# Patient Record
Sex: Female | Born: 1995 | Race: White | Hispanic: No | Smoking: Former smoker
Health system: Southern US, Community
[De-identification: ages and names within clinical notes are randomized; demographics above are authoritative.]

## PROBLEM LIST (undated history)

## (undated) VITALS — BP 120/71 | HR 114 | Temp 98.4°F | Resp 18 | Ht 62.21 in | Wt 179.7 lb

## (undated) VITALS — BP 116/78 | HR 111 | Temp 98.2°F | Resp 18 | Ht 62.01 in | Wt 184.3 lb

## (undated) DIAGNOSIS — E079 Disorder of thyroid, unspecified: Secondary | ICD-10-CM

## (undated) DIAGNOSIS — E669 Obesity, unspecified: Secondary | ICD-10-CM

## (undated) DIAGNOSIS — F32A Depression, unspecified: Secondary | ICD-10-CM

## (undated) DIAGNOSIS — N201 Calculus of ureter: Secondary | ICD-10-CM

## (undated) DIAGNOSIS — F329 Major depressive disorder, single episode, unspecified: Secondary | ICD-10-CM

## (undated) DIAGNOSIS — F419 Anxiety disorder, unspecified: Secondary | ICD-10-CM

## (undated) HISTORY — PX: NO PAST SURGERIES: SHX2092

---
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
## 2020-10-14 ENCOUNTER — Other Ambulatory Visit: Payer: Self-pay

## 2020-10-14 ENCOUNTER — Emergency Department (HOSPITAL_COMMUNITY)
Admission: EM | Admit: 2020-10-14 | Discharge: 2020-10-15 | Disposition: A | Discharge status: Home / Self Care | Payer: Self-pay | Attending: Emergency Medicine | Admitting: Emergency Medicine

## 2020-10-14 ENCOUNTER — Encounter (HOSPITAL_COMMUNITY): Payer: Self-pay | Admitting: Emergency Medicine

## 2020-10-14 DIAGNOSIS — Z87891 Personal history of nicotine dependence: Secondary | ICD-10-CM | POA: Insufficient documentation

## 2020-10-14 DIAGNOSIS — N200 Calculus of kidney: Secondary | ICD-10-CM | POA: Insufficient documentation

## 2020-10-14 LAB — COMPREHENSIVE METABOLIC PANEL
ALT: 30 U/L (ref 0–44)
AST: 26 U/L (ref 15–41)
Albumin: 4.1 g/dL (ref 3.5–5.0)
Alkaline Phosphatase: 71 U/L (ref 38–126)
Anion gap: 12 (ref 5–15)
BUN: 7 mg/dL (ref 6–20)
CO2: 22 mmol/L (ref 22–32)
Calcium: 9.7 mg/dL (ref 8.9–10.3)
Chloride: 106 mmol/L (ref 98–111)
Creatinine, Ser: 0.98 mg/dL (ref 0.44–1.00)
GFR, Estimated: >60 mL/min (ref >60)
Glucose, Bld: 97 mg/dL (ref 70–99)
Potassium: 3.8 mmol/L (ref 3.5–5.1)
Sodium: 140 mmol/L (ref 135–145)
Total Bilirubin: 0.8 mg/dL (ref 0.3–1.2)
Total Protein: 7.1 g/dL (ref 6.5–8.1)

## 2020-10-14 LAB — URINALYSIS, ROUTINE W REFLEX MICROSCOPIC
Bilirubin Urine: NEGATIVE
Glucose, UA: NEGATIVE mg/dL
Ketones, ur: NEGATIVE mg/dL
Nitrite: NEGATIVE
Protein, ur: NEGATIVE mg/dL
Specific Gravity, Urine: 1.002 — ABNORMAL LOW (ref 1.005–1.030)
pH: 6 (ref 5.0–8.0)

## 2020-10-14 LAB — CBC
HCT: 44.8 % (ref 36.0–46.0)
Hemoglobin: 15.2 g/dL — ABNORMAL HIGH (ref 12.0–15.0)
MCH: 31.3 pg (ref 26.0–34.0)
MCHC: 33.9 g/dL (ref 30.0–36.0)
MCV: 92.4 fL (ref 80.0–100.0)
Platelets: 319 10*3/uL (ref 150–400)
RBC: 4.85 MIL/uL (ref 3.87–5.11)
RDW: 11.8 % (ref 11.5–15.5)
WBC: 11.7 10*3/uL — ABNORMAL HIGH (ref 4.0–10.5)
nRBC: 0 % (ref 0.0–0.2)

## 2020-10-14 LAB — I-STAT BETA HCG BLOOD, ED (MC, WL, AP ONLY): I-stat hCG, quantitative: <5 m[IU]/mL (ref <5)

## 2020-10-14 LAB — LIPASE, BLOOD: Lipase: 45 U/L (ref 11–51)

## 2020-10-14 NOTE — ED Triage Notes (Signed)
Patient reports RLQ pain for several months  with emesis and diarrhea , denies fever or chills .

## 2020-10-15 ENCOUNTER — Emergency Department (HOSPITAL_COMMUNITY): Payer: Self-pay

## 2020-10-15 ENCOUNTER — Encounter: Payer: Self-pay | Admitting: *Deleted

## 2020-10-15 MED ORDER — IOHEXOL 300 MG/ML  SOLN
100.0000 mL | Freq: Once | INTRAMUSCULAR | Status: AC | PRN
  Administered 2020-10-15: 100 mL via INTRAVENOUS

## 2020-10-15 MED ORDER — KETOROLAC TROMETHAMINE 30 MG/ML IJ SOLN
30.0000 mg | Freq: Once | INTRAMUSCULAR | Status: AC
  Administered 2020-10-15: 30 mg via INTRAVENOUS
  Filled 2020-10-15: qty 1

## 2020-10-15 MED ORDER — ACETAMINOPHEN 500 MG PO TABS
1000.0000 mg | ORAL_TABLET | Freq: Once | ORAL | Status: AC
  Administered 2020-10-15: 1000 mg via ORAL
  Filled 2020-10-15: qty 2

## 2020-10-15 MED ORDER — HYDROCODONE-ACETAMINOPHEN 5-325 MG PO TABS
1.0000 | ORAL_TABLET | Freq: Four times a day (QID) | ORAL | 0 refills | Status: DC | PRN
Start: 2020-10-15 — End: 2022-05-14

## 2020-10-15 NOTE — Discharge Instructions (Addendum)
Please follow-up with the urologist if you do not pass the stone in the next 2 to 3 days.  If your symptoms change or worsen return to the emergency department.  Take pain medication as prescribed.   You will need to strain your urine.

## 2020-10-15 NOTE — Progress Notes (Signed)
RNCM contacted to assist with follow-up appointment.  Pt has [employer] with an assigned PCP.  RNCM explained to pt that she must contact DSS to have PCP changed in order to be seen by another MD.  Pt verbalizes understanding.  Nicholas Trompeter J. Lucretia Roers, RN, BSN, [place] [phone]

## 2020-10-15 NOTE — ED Provider Notes (Signed)
[facility] EMERGENCY DEPARTMENT Provider Note   CSN: [id number] Arrival date & time: 10/14/20  2152     History Chief Complaint  Patient presents with  . Abdominal Pain    Patty Wilcox is a 24 y.o. female.  Patient presents to the emergency department with a chief complaint of right lower abdominal pain.  She states pain worsened today, but she has experienced it intermittently for months.  She denies fever, chills. She reports associated vomiting and diarrhea. She states that she does feel anxious, and "not right."  She reports having these types of symptoms for over a year, but the right lower abdominal pain has worsened today.  She states that she has been constipated.  She denies any dysuria.  Denies new or unusual vaginal discharge or bleeding.  The history is provided by the patient. No language interpreter was used.       Past Medical History:  Diagnosis Date  . Anxiety   . Anxiety   . Depression   . Obesity     Patient Active Problem List   Diagnosis Date Noted  . Tobacco abuse 11/13/2015  . Borderline personality disorder (HCC) 04/23/2012  . 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 Generalized anxiety disorder 04/19/2012  . Oppositional defiant disorder 03/25/2012    Past Surgical History:  Procedure Laterality Date  . NO PAST SURGERIES       OB History   No obstetric history on file.     Family History  Problem Relation Age of Onset  . Mental illness Mother   . Drug abuse Mother   . Depression Father   . Hypertension Father     Social History   Tobacco Use  . Smoking status: Former Smoker    Packs/day: 1.00    Years: 4.00    Pack years: 4.00    Types: Cigarettes  . Smokeless tobacco: Never Used  Substance Use Topics  . Alcohol use: No  . Drug use: No    Types: Marijuana    Comment: Twice per year    Home Medications Prior to Admission medications   Medication Sig Start Date End Date Taking?  Authorizing Provider  albuterol (PROVENTIL HFA;VENTOLIN HFA) 108 (90 BASE) MCG/ACT inhaler Inhale 2 puffs into the lungs every 4 (four) hours as needed for wheezing or shortness of breath (cough, shortness of breath or wheezing.). 11/13/15   Dorna Leitz, PA-C  chlorpheniramine-HYDROcodone (TUSSIONEX PENNKINETIC ER) 10-8 MG/5ML SUER Take 5 mLs by mouth every 12 (twelve) hours as needed for cough. 11/13/15   Dorna Leitz, PA-C    Allergies    Other  Review of Systems   Review of Systems  All other systems reviewed and are negative.   Physical Exam Updated Vital Signs BP (!) 141/102   Pulse 99   Temp 98.6 F (37 C) (Oral)   Resp 17   Ht 5\' 3"  (1.6 m)   Wt 101 kg   LMP 10/04/2020   SpO2 100%   BMI 39.44 kg/m   Physical Exam Vitals and nursing note reviewed.  Constitutional:      General: She is not in acute distress.    Appearance: She is well-developed.  HENT:     Head: Normocephalic and atraumatic.  Eyes:     Conjunctiva/sclera: Conjunctivae normal.  Cardiovascular:     Rate and Rhythm: Normal rate and regular rhythm.     Heart sounds: No murmur heard.   Pulmonary:  Effort: Pulmonary effort is normal. No respiratory distress.     Breath sounds: Normal breath sounds.  Abdominal:     Palpations: Abdomen is soft.     Tenderness: There is abdominal tenderness.  Musculoskeletal:        General: Normal range of motion.     Cervical back: Neck supple.  Skin:    General: Skin is warm and dry.  Neurological:     Mental Status: She is alert and oriented to person, place, and time.  Psychiatric:        Mood and Affect: Mood normal.        Behavior: Behavior normal.     ED Results / Procedures / Treatments   Labs (all labs ordered are listed, but only abnormal results are displayed) Labs Reviewed  CBC - Abnormal; Notable for the following components:      Result Value   WBC 11.7 (*)    Hemoglobin 15.2 (*)    All other components within normal limits    URINALYSIS, ROUTINE W REFLEX MICROSCOPIC - Abnormal; Notable for the following components:   Color, Urine STRAW (*)    APPearance HAZY (*)    Specific Gravity, Urine 1.002 (*)    Hgb urine dipstick MODERATE (*)    Leukocytes,Ua SMALL (*)    Bacteria, UA RARE (*)    All other components within normal limits  LIPASE, BLOOD  COMPREHENSIVE METABOLIC PANEL  I-STAT BETA HCG BLOOD, ED ([facility], [facility], [facility] ONLY)    EKG None  Radiology CT ABDOMEN PELVIS W CONTRAST  Result Date: 10/15/2020 CLINICAL DATA:  Right lower quadrant abdominal pain with vomiting and diarrhea EXAM: CT ABDOMEN AND PELVIS WITH CONTRAST TECHNIQUE: Multidetector CT imaging of the abdomen and pelvis was performed using the standard protocol following bolus administration of intravenous contrast. CONTRAST:  OMNIPAQUE IOHEXOL 300 MG/ML  SOLN COMPARISON:  09/11/2009 FINDINGS: LOWER CHEST: Right basilar atelectasis. HEPATOBILIARY: Normal hepatic contours. No intra- or extrahepatic biliary dilatation. The gallbladder is normal. PANCREAS: Normal pancreas. No ductal dilatation or peripancreatic fluid collection. SPLEEN: Normal. ADRENALS/URINARY TRACT: The adrenal glands are normal. 4 mm left mid ureteral calculus with mild hydroureter. Right kidney and ureter are normal. The urinary bladder is normal for degree of distention STOMACH/BOWEL: There is no hiatal hernia. Normal duodenal course and caliber. No small bowel dilatation or inflammation. No focal colonic abnormality. Normal appendix. VASCULAR/LYMPHATIC: Normal course and caliber of the major abdominal vessels. No abdominal or pelvic lymphadenopathy. REPRODUCTIVE: Normal uterus. No adnexal mass. MUSCULOSKELETAL. No bony spinal canal stenosis or focal osseous abnormality. OTHER: None. IMPRESSION: 1. 4 mm left mid ureteral calculus with mild hydroureter. 2. Normal appendix. Electronically Signed   By: Deatra Robinson M.D.   On: 10/15/2020 03:35    Procedures Procedures (including critical  care time)  Medications Ordered in ED Medications  iohexol (OMNIPAQUE) 300 MG/ML solution 100 mL (100 mLs Intravenous Contrast Given 10/15/20 0321)    ED Course  I have reviewed the triage vital signs and the nursing notes.  Pertinent labs & imaging results that were available during my care of the patient were reviewed by me and considered in my medical decision making (see chart for details).    MDM Rules/Calculators/A&P                          Patient presents to the emergency department with abdominal pain.  She also complains of anxiety.  She states she has been having  intermittent abdominal pain for several months.  She has had some nausea, vomiting, diarrhea.  She does have some mild to moderate right lower quadrant abdominal tenderness, I have lower suspicion for appendicitis, but given her ongoing symptoms, feel that it may be reasonable to get a CT scan.  CT shows 4 mm ureterolithiasis.  No evidence of infection on UA.  Pain is well controlled.  We will plan for discharge with outpatient urology follow-up.  I have placed consult for social work for PCP needs.  Return precautions discussed. Final Clinical Impression(s) / ED Diagnoses Final diagnoses:  Kidney stone    Rx / DC Orders ED Discharge Orders    None       Roxy Horseman, PA-C 10/15/20 0354    Mesner, Barbara Cower, MD 10/15/20 [phone]

## 2020-10-15 NOTE — ED Notes (Signed)
Pt outside.  

## 2021-02-17 ENCOUNTER — Emergency Department (HOSPITAL_COMMUNITY): Payer: Self-pay

## 2021-02-17 ENCOUNTER — Emergency Department (HOSPITAL_COMMUNITY)
Admission: EM | Admit: 2021-02-17 | Discharge: 2021-02-17 | Disposition: A | Discharge status: Home / Self Care | Payer: Self-pay | Attending: Emergency Medicine | Admitting: Emergency Medicine

## 2021-02-17 ENCOUNTER — Encounter (HOSPITAL_COMMUNITY): Payer: Self-pay

## 2021-02-17 ENCOUNTER — Other Ambulatory Visit: Payer: Self-pay

## 2021-02-17 DIAGNOSIS — Z7982 Long term (current) use of aspirin: Secondary | ICD-10-CM | POA: Insufficient documentation

## 2021-02-17 DIAGNOSIS — R197 Diarrhea, unspecified: Secondary | ICD-10-CM | POA: Insufficient documentation

## 2021-02-17 DIAGNOSIS — R1031 Right lower quadrant pain: Secondary | ICD-10-CM

## 2021-02-17 DIAGNOSIS — N390 Urinary tract infection, site not specified: Secondary | ICD-10-CM | POA: Insufficient documentation

## 2021-02-17 DIAGNOSIS — F17210 Nicotine dependence, cigarettes, uncomplicated: Secondary | ICD-10-CM | POA: Insufficient documentation

## 2021-02-17 HISTORY — DX: Disorder of thyroid, unspecified: E07.9

## 2021-02-17 LAB — URINALYSIS, ROUTINE W REFLEX MICROSCOPIC
Bilirubin Urine: NEGATIVE
Glucose, UA: NEGATIVE mg/dL
Ketones, ur: NEGATIVE mg/dL
Nitrite: NEGATIVE
Protein, ur: NEGATIVE mg/dL
Specific Gravity, Urine: 1.019 (ref 1.005–1.030)
pH: 6 (ref 5.0–8.0)

## 2021-02-17 LAB — COMPREHENSIVE METABOLIC PANEL
ALT: 30 U/L (ref 0–44)
AST: 39 U/L (ref 15–41)
Albumin: 4.4 g/dL (ref 3.5–5.0)
Alkaline Phosphatase: 81 U/L (ref 38–126)
Anion gap: 14 (ref 5–15)
BUN: 12 mg/dL (ref 6–20)
CO2: 21 mmol/L — ABNORMAL LOW (ref 22–32)
Calcium: 9.9 mg/dL (ref 8.9–10.3)
Chloride: 105 mmol/L (ref 98–111)
Creatinine, Ser: 1.04 mg/dL — ABNORMAL HIGH (ref 0.44–1.00)
GFR, Estimated: >60 mL/min (ref >60)
Glucose, Bld: 98 mg/dL (ref 70–99)
Potassium: 3.4 mmol/L — ABNORMAL LOW (ref 3.5–5.1)
Sodium: 140 mmol/L (ref 135–145)
Total Bilirubin: 0.8 mg/dL (ref 0.3–1.2)
Total Protein: 7.8 g/dL (ref 6.5–8.1)

## 2021-02-17 LAB — CBC
HCT: 43.6 % (ref 36.0–46.0)
Hemoglobin: 15.2 g/dL — ABNORMAL HIGH (ref 12.0–15.0)
MCH: 32.5 pg (ref 26.0–34.0)
MCHC: 34.9 g/dL (ref 30.0–36.0)
MCV: 93.2 fL (ref 80.0–100.0)
Platelets: 321 10*3/uL (ref 150–400)
RBC: 4.68 MIL/uL (ref 3.87–5.11)
RDW: 12.5 % (ref 11.5–15.5)
WBC: 13.8 10*3/uL — ABNORMAL HIGH (ref 4.0–10.5)
nRBC: 0 % (ref 0.0–0.2)

## 2021-02-17 LAB — I-STAT BETA HCG BLOOD, ED (MC, WL, AP ONLY): I-stat hCG, quantitative: <5 m[IU]/mL (ref <5)

## 2021-02-17 LAB — LIPASE, BLOOD: Lipase: 30 U/L (ref 11–51)

## 2021-02-17 MED ORDER — KETOROLAC TROMETHAMINE 30 MG/ML IJ SOLN
30.0000 mg | Freq: Once | INTRAMUSCULAR | Status: AC
  Administered 2021-02-17: 30 mg via INTRAVENOUS
  Filled 2021-02-17: qty 1

## 2021-02-17 MED ORDER — IOHEXOL 300 MG/ML  SOLN
100.0000 mL | Freq: Once | INTRAMUSCULAR | Status: AC | PRN
  Administered 2021-02-17: 100 mL via INTRAVENOUS

## 2021-02-17 MED ORDER — ONDANSETRON HCL 4 MG/2ML IJ SOLN
4.0000 mg | Freq: Once | INTRAMUSCULAR | Status: AC
  Administered 2021-02-17: 4 mg via INTRAVENOUS
  Filled 2021-02-17: qty 2

## 2021-02-17 MED ORDER — MORPHINE SULFATE (PF) 4 MG/ML IV SOLN
4.0000 mg | Freq: Once | INTRAVENOUS | Status: AC
  Administered 2021-02-17: 4 mg via INTRAVENOUS
  Filled 2021-02-17: qty 1

## 2021-02-17 MED ORDER — NITROFURANTOIN MONOHYD MACRO 100 MG PO CAPS
100.0000 mg | ORAL_CAPSULE | Freq: Once | ORAL | Status: AC
  Administered 2021-02-17: 100 mg via ORAL
  Filled 2021-02-17: qty 1

## 2021-02-17 MED ORDER — NITROFURANTOIN MONOHYD MACRO 100 MG PO CAPS: 100.0000 mg | ORAL_CAPSULE | Freq: Two times a day (BID) | ORAL | 0 refills | Status: AC

## 2021-02-17 MED ORDER — SODIUM CHLORIDE 0.9 % IV BOLUS
1000.0000 mL | Freq: Once | INTRAVENOUS | Status: AC
  Administered 2021-02-17: 1000 mL via INTRAVENOUS

## 2021-02-17 NOTE — ED Provider Notes (Signed)
[facility]-EMERGENCY DEPT Provider Note   CSN: [id number] Arrival date & time: 02/17/21  1755     History Chief Complaint  Patient presents with  . Abdominal Pain    Patty Wilcox is a 25 y.o. female.  The history is provided by the patient.  Abdominal Pain Pain location:  RLQ Pain quality: stabbing   Pain radiates to:  Does not radiate Pain severity:  Severe Onset quality:  Sudden Duration:  1 day Timing:  Constant Chronicity:  Recurrent Context comment:  Diagnosed with a 4 mm kidney stone many months ago.  Symptoms resolved spontaneously and returned today. Relieved by:  Nothing Exacerbated by: Sitting up. Ineffective treatments:  None tried Associated symptoms: diarrhea and nausea   Associated symptoms: no anorexia, no chest pain, no chills, no constipation, no cough, no dysuria, no fever, no hematuria, no shortness of breath, no sore throat, no vaginal bleeding, no vaginal discharge and no vomiting   Associated symptoms comment:  Menstrual period 2 weeks ago.  States her urine has been somewhat cloudy recently. Risk factors comment:  [employer] and has received care mainly through urgent cares and emergency departments.  States that she has a variety of chronic and unaddressed health concerns.      Past Medical History:  Diagnosis Date  . Anxiety   . Anxiety   . Depression   . Obesity   . Thyroid disease     Patient Active Problem List   Diagnosis Date Noted  . Tobacco abuse 11/13/2015  . Borderline personality disorder (HCC) 04/23/2012  . 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 Generalized anxiety disorder 04/19/2012  . Oppositional defiant disorder 03/25/2012    Past Surgical History:  Procedure Laterality Date  . NO PAST SURGERIES       OB History   No obstetric history on file.     Family History  Problem Relation Age of Onset  . Mental illness Mother   . Drug abuse Mother   . Depression  Father   . Hypertension Father     Social History   Tobacco Use  . Smoking status: Current Every Day Smoker    Packs/day: 0.15    Years: 4.00    Pack years: 0.60    Types: Cigarettes  . Smokeless tobacco: Never Used  Vaping Use  . Vaping Use: Every day  . Substances: Nicotine, Flavoring  Substance Use Topics  . Alcohol use: No  . Drug use: Yes    Types: Marijuana    Comment: Twice per year    Home Medications Prior to Admission medications   Medication Sig Start Date End Date Taking? Authorizing Provider  aspirin-acetaminophen-caffeine (EXCEDRIN MIGRAINE) [phone] MG tablet Take 2 tablets by mouth every 6 (six) hours as needed for headache.    [provider]  HYDROcodone-acetaminophen (NORCO/VICODIN) 5-325 MG tablet Take 1-2 tablets by mouth every 6 (six) hours as needed. 10/15/20   Roxy Horseman, PA-C    Allergies    Other  Review of Systems   Review of Systems  Constitutional: Negative for chills and fever.  HENT: Negative for ear pain and sore throat.   Eyes: Negative for pain and visual disturbance.  Respiratory: Negative for cough and shortness of breath.   Cardiovascular: Negative for chest pain and palpitations.  Gastrointestinal: Positive for abdominal pain, diarrhea and nausea. Negative for anorexia, constipation and vomiting.       At least 1 year of chronic and intermittent changes in  bowel movements  Endocrine:       Was told she had hypothyroidism but is untreated.  Genitourinary: Negative for dysuria, hematuria, vaginal bleeding and vaginal discharge.  Musculoskeletal: Negative for arthralgias and back pain.  Skin: Negative for color change and rash.  Neurological: Negative for seizures and syncope.  All other systems reviewed and are negative.   Physical Exam Updated Vital Signs BP (!) 159/115 (BP Location: Left Arm)   Pulse (!) 140   Temp 98.2 F (36.8 C) (Oral)   Resp 16   Ht 5\' 3"  (1.6 m)   Wt 93 kg   LMP 01/29/2021  (Approximate)   SpO2 100%   BMI 36.31 kg/m   Physical Exam Vitals and nursing note reviewed.  Constitutional:      Comments: Standing up in the room, pacing, holding her right lower quadrant  HENT:     Head: Normocephalic and atraumatic.  Eyes:     General: No scleral icterus. Pulmonary:     Effort: Pulmonary effort is normal. No respiratory distress.  Abdominal:     Tenderness: There is abdominal tenderness in the right lower quadrant. There is guarding. There is no right CVA tenderness or left CVA tenderness.  Musculoskeletal:     Cervical back: Normal range of motion.  Skin:    General: Skin is warm and dry.  Neurological:     General: No focal deficit present.     Mental Status: She is alert.  Psychiatric:        Mood and Affect: Mood normal.     ED Results / Procedures / Treatments   Labs (all labs ordered are listed, but only abnormal results are displayed) Labs Reviewed  COMPREHENSIVE METABOLIC PANEL - Abnormal; Notable for the following components:      Result Value   Potassium 3.4 (*)    CO2 21 (*)    Creatinine, Ser 1.04 (*)    All other components within normal limits  CBC - Abnormal; Notable for the following components:   WBC 13.8 (*)    Hemoglobin 15.2 (*)    All other components within normal limits  URINALYSIS, ROUTINE W REFLEX MICROSCOPIC - Abnormal; Notable for the following components:   APPearance TURBID (*)    Hgb urine dipstick MODERATE (*)    Leukocytes,Ua LARGE (*)    Bacteria, UA RARE (*)    All other components within normal limits  LIPASE, BLOOD  I-STAT BETA HCG BLOOD, ED ([facility], [facility], [facility] ONLY)    EKG None  Radiology CT Abdomen Pelvis W Contrast  Result Date: 02/17/2021 CLINICAL DATA:  Acute right lower quadrant abdominal pain. EXAM: CT ABDOMEN AND PELVIS WITH CONTRAST TECHNIQUE: Multidetector CT imaging of the abdomen and pelvis was performed using the standard protocol following bolus administration of intravenous contrast. CONTRAST:   02/19/2021 OMNIPAQUE IOHEXOL 300 MG/ML  SOLN COMPARISON:  October 15, 2020. FINDINGS: Lower chest: No acute abnormality. Hepatobiliary: No focal liver abnormality is seen. No gallstones, gallbladder wall thickening, or biliary dilatation. Pancreas: Unremarkable. No pancreatic ductal dilatation or surrounding inflammatory changes. Spleen: Normal in size without focal abnormality. Adrenals/Urinary Tract: Adrenal glands are unremarkable. Nonobstructive left renal calculus is noted. No hydronephrosis or renal obstruction is noted. Bladder is unremarkable. Stomach/Bowel: Stomach is within normal limits. Appendix appears normal. No evidence of bowel wall thickening, distention, or inflammatory changes. Vascular/Lymphatic: No significant vascular findings are present. No enlarged abdominal or pelvic lymph nodes. Reproductive: Uterus and bilateral adnexa are unremarkable. Other: No abdominal wall hernia or  abnormality. No abdominopelvic ascites. Musculoskeletal: No acute or significant osseous findings. IMPRESSION: 1. Nonobstructive left renal calculus. No hydronephrosis or renal obstruction is noted. 2. No other abnormality seen in the abdomen or pelvis. Electronically Signed   By: Lupita Raider M.D.   On: 02/17/2021 21:11    Procedures Procedures   Medications Ordered in ED Medications  sodium chloride 0.9 % bolus 1,000 mL (0 mLs Intravenous Stopped 02/17/21 1923)  ketorolac (TORADOL) 30 MG/ML injection 30 mg (30 mg Intravenous Given 02/17/21 1834)  ondansetron (ZOFRAN) injection 4 mg (4 mg Intravenous Given 02/17/21 1835)  morphine 4 MG/ML injection 4 mg (4 mg Intravenous Given 02/17/21 1941)  ondansetron (ZOFRAN) injection 4 mg (4 mg Intravenous Given 02/17/21 1941)  iohexol (OMNIPAQUE) 300 MG/ML solution 100 mL (100 mLs Intravenous Contrast Given 02/17/21 2048)    ED Course  I have reviewed the triage vital signs and the nursing notes.  Pertinent labs & imaging results that were available during my care of  the patient were reviewed by me and considered in my medical decision making (see chart for details).  The patient presents with severe RLQ pain. She was evaluated for appendicitis, renal colic, biliary pathology. ED evaluation was relatively normal with the exception of a probable UTI for which she will be treated. Careful return precautions were discussed. MDM Rules/Calculators/A&P     Final Clinical Impression(s) / ED Diagnoses Final diagnoses:  Right lower quadrant abdominal pain  Lower urinary tract infectious disease    Rx / DC Orders ED Discharge Orders         Ordered    nitrofurantoin, macrocrystal-monohydrate, (MACROBID) 100 MG capsule  2 times daily        02/17/21 2149           Koleen Distance, MD 02/17/21 2152

## 2021-02-17 NOTE — ED Triage Notes (Signed)
Patient c/o RLQ pain x 1 week. patient also c/o nausea and diarrhea x 2. patient denies any urinary issues.

## 2021-12-23 ENCOUNTER — Emergency Department (HOSPITAL_COMMUNITY)
Admission: EM | Admit: 2021-12-23 | Discharge: 2021-12-23 | Disposition: A | Discharge status: Home / Self Care | Payer: Self-pay | Attending: Emergency Medicine | Admitting: Emergency Medicine

## 2021-12-23 ENCOUNTER — Emergency Department (HOSPITAL_COMMUNITY): Payer: Self-pay

## 2021-12-23 ENCOUNTER — Other Ambulatory Visit: Payer: Self-pay

## 2021-12-23 ENCOUNTER — Encounter (HOSPITAL_COMMUNITY): Payer: Self-pay | Admitting: Radiology

## 2021-12-23 DIAGNOSIS — N9489 Other specified conditions associated with female genital organs and menstrual cycle: Secondary | ICD-10-CM | POA: Insufficient documentation

## 2021-12-23 DIAGNOSIS — N200 Calculus of kidney: Secondary | ICD-10-CM | POA: Insufficient documentation

## 2021-12-23 LAB — CBC
HCT: 41.4 % (ref 36.0–46.0)
Hemoglobin: 14.3 g/dL (ref 12.0–15.0)
MCH: 31.3 pg (ref 26.0–34.0)
MCHC: 34.5 g/dL (ref 30.0–36.0)
MCV: 90.6 fL (ref 80.0–100.0)
Platelets: 313 10*3/uL (ref 150–400)
RBC: 4.57 MIL/uL (ref 3.87–5.11)
RDW: 12.2 % (ref 11.5–15.5)
WBC: 10.8 10*3/uL — ABNORMAL HIGH (ref 4.0–10.5)
nRBC: 0 % (ref 0.0–0.2)

## 2021-12-23 LAB — URINALYSIS, MICROSCOPIC (REFLEX)

## 2021-12-23 LAB — URINALYSIS, ROUTINE W REFLEX MICROSCOPIC
Bilirubin Urine: NEGATIVE
Glucose, UA: NEGATIVE mg/dL
Ketones, ur: NEGATIVE mg/dL
Nitrite: NEGATIVE
Protein, ur: NEGATIVE mg/dL
Specific Gravity, Urine: <1.005 — ABNORMAL LOW (ref 1.005–1.030)
pH: 6.5 (ref 5.0–8.0)

## 2021-12-23 LAB — COMPREHENSIVE METABOLIC PANEL
ALT: 35 U/L (ref 0–44)
AST: 31 U/L (ref 15–41)
Albumin: 4.1 g/dL (ref 3.5–5.0)
Alkaline Phosphatase: 62 U/L (ref 38–126)
Anion gap: 12 (ref 5–15)
BUN: 11 mg/dL (ref 6–20)
CO2: 19 mmol/L — ABNORMAL LOW (ref 22–32)
Calcium: 9.4 mg/dL (ref 8.9–10.3)
Chloride: 108 mmol/L (ref 98–111)
Creatinine, Ser: 1.08 mg/dL — ABNORMAL HIGH (ref 0.44–1.00)
GFR, Estimated: >60 mL/min (ref >60)
Glucose, Bld: 114 mg/dL — ABNORMAL HIGH (ref 70–99)
Potassium: 4.2 mmol/L (ref 3.5–5.1)
Sodium: 139 mmol/L (ref 135–145)
Total Bilirubin: 0.2 mg/dL — ABNORMAL LOW (ref 0.3–1.2)
Total Protein: 7.1 g/dL (ref 6.5–8.1)

## 2021-12-23 LAB — LIPASE, BLOOD: Lipase: 44 U/L (ref 11–51)

## 2021-12-23 LAB — I-STAT BETA HCG BLOOD, ED (MC, WL, AP ONLY): I-stat hCG, quantitative: <5 m[IU]/mL (ref <5)

## 2021-12-23 MED ORDER — ONDANSETRON 4 MG PO TBDP: ORAL_TABLET | ORAL | 0 refills | Status: DC

## 2021-12-23 MED ORDER — ACETAMINOPHEN 500 MG PO TABS
1000.0000 mg | ORAL_TABLET | Freq: Once | ORAL | Status: AC
  Administered 2021-12-23: 1000 mg via ORAL
  Filled 2021-12-23: qty 2

## 2021-12-23 MED ORDER — OXYCODONE-ACETAMINOPHEN 5-325 MG PO TABS
1.0000 | ORAL_TABLET | Freq: Once | ORAL | Status: AC
  Administered 2021-12-23: 1 via ORAL
  Filled 2021-12-23: qty 1

## 2021-12-23 MED ORDER — KETOROLAC TROMETHAMINE 15 MG/ML IJ SOLN
15.0000 mg | Freq: Once | INTRAMUSCULAR | Status: AC
  Administered 2021-12-23: 15 mg via INTRAVENOUS
  Filled 2021-12-23: qty 1

## 2021-12-23 MED ORDER — IOHEXOL 300 MG/ML  SOLN
100.0000 mL | Freq: Once | INTRAMUSCULAR | Status: AC | PRN
  Administered 2021-12-23: 100 mL via INTRAVENOUS

## 2021-12-23 MED ORDER — ONDANSETRON 4 MG PO TBDP
4.0000 mg | ORAL_TABLET | Freq: Once | ORAL | Status: AC | PRN
Start: 2021-12-23 — End: 2021-12-23
  Administered 2021-12-23: 4 mg via ORAL
  Filled 2021-12-23: qty 1

## 2021-12-23 NOTE — ED Triage Notes (Signed)
Pt reports one hour of LLQ pain with some associated nausea. Tried heating pad without relief. Reports hx of kidney stones.

## 2021-12-23 NOTE — Discharge Instructions (Addendum)
Use Zofran as needed for nausea and vomiting. Take Tylenol 1000 mg and ibuprofen 600 mg together every 6 hours for severe pain. Return for persistent vomiting, uncontrolled pain, fevers or new concerns. Follow-up with urology.

## 2021-12-23 NOTE — ED Provider Triage Note (Signed)
Emergency Medicine Provider Triage Evaluation Note  Patty Wilcox , a 26 y.o. female  was evaluated in triage.  Pt complains of abdominal pain. States that same began 1 hour prior to arrival and awoke her from sleep. States that the pain is 10/10 in nature and has been unchanged since onset. Endorses some associated nausea without vomiting or diarrhea. Denies and dysuria or hematuria. LMP with 1.5 weeks ago and was normal. Hx of kidney stones in the past, however states that the pain has never been in this location or this severe before.  Review of Systems  Positive:  Negative: See above  Physical Exam  BP (!) 126/101 (BP Location: Left Arm)    Pulse (!) 128    Temp 98.9 F (37.2 C) (Oral)    Resp (!) 22    LMP 12/13/2021 (Exact Date)    SpO2 98%  Gen:   Awake, tearful on exam in some distress Resp:  Normal effort  MSK:   Moves extremities without difficulty  Other:  Very tender in LLQ  Medical Decision Making  Medically screening exam initiated at 9:36 AM.  Appropriate orders placed.  Patty Wilcox was informed that the remainder of the evaluation will be completed by another provider, this initial triage assessment does not replace that evaluation, and the importance of remaining in the ED until their evaluation is complete.     Nestor Lewandowsky 12/23/21 [phone]

## 2021-12-23 NOTE — ED Provider Notes (Signed)
[facility] EMERGENCY DEPARTMENT Provider Note   CSN: [id number] Arrival date & time: 12/23/21  0915     History  Chief Complaint  Patient presents with   Abdominal Pain    Patty Wilcox is a 26 y.o. female.  Patient presents with intermittent left flank left lower quadrant pain with intermittent nausea.  Tried heating pad without relief.  History of kidney stones.  No current doctors no insurance.  No fevers chills or vomiting.      Home Medications Prior to Admission medications   Medication Sig Start Date End Date Taking? Authorizing Provider  aspirin-acetaminophen-caffeine (EXCEDRIN MIGRAINE) [phone] MG tablet Take 2 tablets by mouth every 6 (six) hours as needed for headache.    [provider]  HYDROcodone-acetaminophen (NORCO/VICODIN) 5-325 MG tablet Take 1-2 tablets by mouth every 6 (six) hours as needed. 10/15/20   Montine Circle, PA-C      Allergies    Other    Review of Systems   Review of Systems  Constitutional:  Negative for chills and fever.  HENT:  Negative for congestion.   Eyes:  Negative for visual disturbance.  Respiratory:  Negative for shortness of breath.   Cardiovascular:  Negative for chest pain.  Gastrointestinal:  Negative for abdominal pain and vomiting.  Genitourinary:  Positive for difficulty urinating and flank pain. Negative for dysuria.  Musculoskeletal:  Negative for back pain, neck pain and neck stiffness.  Skin:  Negative for rash.  Neurological:  Negative for light-headedness and headaches.   Physical Exam Updated Vital Signs BP (!) 126/101 (BP Location: Left Arm)    Pulse (!) 128    Temp 98.9 F (37.2 C) (Oral)    Resp (!) 22    LMP 12/13/2021 (Exact Date)    SpO2 98%  Physical Exam Vitals and nursing note reviewed.  Constitutional:      General: She is not in acute distress.    Appearance: She is well-developed.  HENT:     Head: Normocephalic and atraumatic.     Mouth/Throat:     Mouth:  Mucous membranes are moist.  Eyes:     General:        Right eye: No discharge.        Left eye: No discharge.     Conjunctiva/sclera: Conjunctivae normal.  Neck:     Trachea: No tracheal deviation.  Cardiovascular:     Rate and Rhythm: Normal rate and regular rhythm.     Heart sounds: No murmur heard. Pulmonary:     Effort: Pulmonary effort is normal.     Breath sounds: Normal breath sounds.  Abdominal:     General: There is no distension.     Palpations: Abdomen is soft.     Tenderness: There is abdominal tenderness in the left lower quadrant. There is no guarding.  Musculoskeletal:     Cervical back: Normal range of motion and neck supple. No rigidity.  Skin:    General: Skin is warm.     Capillary Refill: Capillary refill takes less than 2 seconds.     Findings: No rash.  Neurological:     General: No focal deficit present.     Mental Status: She is alert.     Cranial Nerves: No cranial nerve deficit.  Psychiatric:        Mood and Affect: Mood normal.    ED Results / Procedures / Treatments   Labs (all labs ordered are listed, but only abnormal results are displayed)  Labs Reviewed  COMPREHENSIVE METABOLIC PANEL - Abnormal; Notable for the following components:      Result Value   CO2 19 (*)    Glucose, Bld 114 (*)    Creatinine, Ser 1.08 (*)    Total Bilirubin 0.2 (*)    All other components within normal limits  CBC - Abnormal; Notable for the following components:   WBC 10.8 (*)    All other components within normal limits  URINALYSIS, ROUTINE W REFLEX MICROSCOPIC - Abnormal; Notable for the following components:   Specific Gravity, Urine <1.005 (*)    Hgb urine dipstick SMALL (*)    Leukocytes,Ua SMALL (*)    All other components within normal limits  URINALYSIS, MICROSCOPIC (REFLEX) - Abnormal; Notable for the following components:   Bacteria, UA RARE (*)    All other components within normal limits  URINE CULTURE  LIPASE, BLOOD  I-STAT BETA HCG BLOOD,  ED ([facility], [facility], [facility] ONLY)    EKG None  Radiology CT ABDOMEN PELVIS W CONTRAST  Result Date: 12/23/2021 CLINICAL DATA:  Left lower quadrant abdominal pain EXAM: CT ABDOMEN AND PELVIS WITH CONTRAST TECHNIQUE: Multidetector CT imaging of the abdomen and pelvis was performed using the standard protocol following bolus administration of intravenous contrast. RADIATION DOSE REDUCTION: This exam was performed according to the departmental dose-optimization program which includes automated exposure control, adjustment of the mA and/or kV according to patient size and/or use of iterative reconstruction technique. CONTRAST:  OMNIPAQUE IOHEXOL 300 MG/ML  SOLN COMPARISON:  02/17/2021 FINDINGS: Lower chest: 4 mm right middle lobe subpleural nodule stable since prior study consistent with benign etiology. 5 mm subpleural nodule left base on 19/5 also unchanged consistent with benign etiology. Hepatobiliary: No suspicious focal abnormality within the liver parenchyma. There is no evidence for gallstones, gallbladder wall thickening, or pericholecystic fluid. No intrahepatic or extrahepatic biliary dilation. Pancreas: No focal mass lesion. No dilatation of the main duct. No intraparenchymal cyst. No peripancreatic edema. Spleen: No splenomegaly. No focal mass lesion. Adrenals/Urinary Tract: No adrenal nodule or mass. 2 mm nonobstructing stone identified lower pole right kidney on image 35/series 3. Right ureter unremarkable. There is mild left hydronephrosis with associated decreased perfusion to the left kidney. 1-2 mm nonobstructing stone noted lower pole left kidney on 33/3. The left hydroureter identified down to the junction of the proximal and middle thirds where a 2 x 2 x 3 mm proximal to mid left ureteral stone is identified (axial image 45/3 and coronal 46/6. Distal left ureter unremarkable. The urinary bladder appears normal for the degree of distention. Stomach/Bowel: Stomach is unremarkable. No gastric wall  thickening. No evidence of outlet obstruction. Duodenum is normally positioned as is the ligament of Treitz. No small bowel wall thickening. No small bowel dilatation. The terminal ileum is normal. The appendix is normal. No gross colonic mass. No colonic wall thickening. Vascular/Lymphatic: No abdominal aortic aneurysm. No abdominal lymphadenopathy No pelvic sidewall lymphadenopathy. Reproductive: The uterus is unremarkable.  There is no adnexal mass. Other: No intraperitoneal free fluid. Musculoskeletal: No worrisome lytic or sclerotic osseous abnormality. IMPRESSION: 1. 2 x 2 x 3 mm proximal to mid left ureteral stone with mild left hydronephrosis and decreased perfusion to the left kidney consistent with obstructive uropathy. 2. Tiny bilateral nonobstructing renal stones. Electronically Signed   By: Kennith Center M.D.   On: 12/23/2021 11:14    Procedures Procedures    Medications Ordered in ED Medications  ketorolac (TORADOL) 15 MG/ML injection 15 mg (has no  administration in time range)  acetaminophen (TYLENOL) tablet 1,000 mg (has no administration in time range)  ondansetron (ZOFRAN-ODT) disintegrating tablet 4 mg (4 mg Oral Given 12/23/21 0941)  oxyCODONE-acetaminophen (PERCOCET/ROXICET) 5-325 MG per tablet 1 tablet (1 tablet Oral Given 12/23/21 0941)  iohexol (OMNIPAQUE) 300 MG/ML solution 100 mL (100 mLs Intravenous Contrast Given 12/23/21 1104)    ED Course/ Medical Decision Making/ A&P                           Medical Decision Making Amount and/or Complexity of Data Reviewed Labs: ordered.  Risk OTC drugs. Prescription drug management.   Patient presents with intermittent flank pain clinical concern for kidney stone.  Other differentials include diverticulitis/bowel related, pyelonephritis, ovarian, other.  Patient intermittently writhing in pain, Toradol ordered to help with pain.  Initially tachycardic likely secondary to pain plan for repeat vitals.  Patient blood work  ordered reviewed no significant white blood cell ovation, 10,000, electrolytes overall unremarkable reviewed.  Urinalysis reviewed small amount of blood and small leukocytes negative nitrite culture ordered.  CT scan reviewed showing 3 mm kidney stone with mild hydronephrosis left side.  Plan for pain meds, nausea meds and close follow-up with urology.  Reasons return discussed. Pain improved.  Vital signs improved significantly and heart rate normalized.        Final Clinical Impression(s) / ED Diagnoses Final diagnoses:  Kidney stone on left side    Rx / DC Orders ED Discharge Orders     None         Elnora Morrison, MD 12/23/21 1419

## 2021-12-24 LAB — URINE CULTURE: Culture: <10000 — AB

## 2022-05-13 ENCOUNTER — Other Ambulatory Visit: Payer: Self-pay

## 2022-05-13 ENCOUNTER — Emergency Department (HOSPITAL_BASED_OUTPATIENT_CLINIC_OR_DEPARTMENT_OTHER)
Admission: EM | Admit: 2022-05-13 | Discharge: 2022-05-14 | Disposition: A | Discharge status: Home / Self Care | Payer: Self-pay | Attending: Emergency Medicine | Admitting: Emergency Medicine

## 2022-05-13 ENCOUNTER — Encounter (HOSPITAL_BASED_OUTPATIENT_CLINIC_OR_DEPARTMENT_OTHER): Payer: Self-pay | Admitting: Emergency Medicine

## 2022-05-13 DIAGNOSIS — M545 Low back pain, unspecified: Secondary | ICD-10-CM

## 2022-05-13 DIAGNOSIS — N390 Urinary tract infection, site not specified: Secondary | ICD-10-CM

## 2022-05-13 DIAGNOSIS — Z7982 Long term (current) use of aspirin: Secondary | ICD-10-CM | POA: Insufficient documentation

## 2022-05-13 DIAGNOSIS — F17210 Nicotine dependence, cigarettes, uncomplicated: Secondary | ICD-10-CM | POA: Insufficient documentation

## 2022-05-13 HISTORY — DX: Calculus of ureter: N20.1

## 2022-05-13 NOTE — ED Triage Notes (Signed)
Per pt has had back issues since 26 yo gotten worse in last few weeks. Went to chiropractor 3 weeks ago they did an adjustment. Pt worse now painful to sit, stand, or lay down. States took 2 hours to get out of bed and is unable to work.

## 2022-05-13 NOTE — ED Provider Notes (Signed)
[facility] Provider Note: [name], MD, FACEP  CSN: [id number] MRN: [id number] ARRIVAL: 05/13/22 at 2205 ROOM: MH07/[facility]07   CHIEF COMPLAINT  Back Pain   HISTORY OF PRESENT ILLNESS  05/13/22 11:29 PM Patty Wilcox is a 26 y.o. female with back pain since she was 59.  The back pain is in the low mid back.  It has been progressively worsening over the years to the point that it is nearly disabling.  She went to a chiropractor about 3 weeks ago and had an adjustment and she believes that this made it worse.  She is finding it difficult to bend or move and is worried that she will lose her job because of this.  She has some weakness of the left lower leg that has been present previously and has not acutely changed.  She has had no acute bowel or bladder changes.  She has a history of ureterolithiasis but this pain is different.   Past Medical History:  Diagnosis Date   Anxiety    Anxiety    Depression    Obesity    Thyroid disease    Ureterolithiasis     Past Surgical History:  Procedure Laterality Date   NO PAST SURGERIES      Family History  Problem Relation Age of Onset   Mental illness Mother    Drug abuse Mother    Depression Father    Hypertension Father     Social History   Tobacco Use   Smoking status: Every Day    Packs/day: 0.15    Years: 4.00    Total pack years: 0.60    Types: Cigarettes   Smokeless tobacco: Never  Vaping Use   Vaping Use: Every day   Substances: Nicotine, Flavoring  Substance Use Topics   Alcohol use: Yes    Comment: occ   Drug use: Not Currently    Types: Marijuana    Comment: Twice per year    Prior to Admission medications   Medication Sig Start Date End Date Taking? Authorizing Provider  cyclobenzaprine (FLEXERIL) 10 MG tablet Take 1 tablet (10 mg total) by mouth 3 (three) times daily as needed for muscle spasms. 05/14/22  Yes Grayden Burley, MD  HYDROmorphone (DILAUDID) 2 MG tablet Take 1 tablet (2 mg total)  by mouth every 4 (four) hours as needed for severe pain. 05/14/22  Yes Kayleeann Huxford, MD  oxyCODONE-acetaminophen (PERCOCET) 10-325 MG tablet Take 1 tablet by mouth every 6 (six) hours as needed for pain. 05/14/22  Yes Gearldene Fiorenza, MD  aspirin-acetaminophen-caffeine (EXCEDRIN MIGRAINE) [phone] MG tablet Take 2 tablets by mouth every 6 (six) hours as needed for headache.    [provider]  ondansetron (ZOFRAN-ODT) 4 MG disintegrating tablet 4mg  ODT q4 hours prn nausea/vomit 12/23/21   12/25/21, MD    Allergies Other   REVIEW OF SYSTEMS  Negative except as noted here or in the History of Present Illness.   PHYSICAL EXAMINATION  Initial Vital Signs Blood pressure 133/73, pulse (!) 113, temperature 98.4 F (36.9 C), temperature source Oral, resp. rate 20, height 5\' 3"  (1.6 m), weight 105.2 kg, SpO2 97 %.  Examination General: Well-developed, well-nourished female in no acute distress; appearance consistent with age of record HENT: normocephalic; atraumatic Eyes: Normal appearance Neck: supple Heart: regular rate and rhythm Lungs: clear to auscultation bilaterally Abdomen: soft; nondistended; nontender; bowel sounds present Back: Lower midline tenderness; positive straight leg raise on the left at 5 degrees Extremities:  No deformity; full range of motion; pulses normal Neurologic: Awake, alert and oriented; motor function intact in all extremities and symmetric but examination limited due to pain on movement of hips; no facial droop Skin: Warm and dry Psychiatric: Grimacing   RESULTS  Summary of this visit's results, reviewed and interpreted by myself:   EKG Interpretation  Date/Time:    Ventricular Rate:    PR Interval:    QRS Duration:   QT Interval:    QTC Calculation:   R Axis:     Text Interpretation:         Laboratory Studies: Results for orders placed or performed during the hospital encounter of 05/13/22 (from the past 24 hour(s))  Urinalysis,  Routine w reflex microscopic Urine, Clean Catch     Status: Abnormal   Collection Time: 05/14/22 12:42 AM  Result Value Ref Range   Color, Urine YELLOW YELLOW   APPearance CLEAR CLEAR   Specific Gravity, Urine <=1.005 1.005 - 1.030   pH 7.0 5.0 - 8.0   Glucose, UA NEGATIVE NEGATIVE mg/dL   Hgb urine dipstick NEGATIVE NEGATIVE   Bilirubin Urine NEGATIVE NEGATIVE   Ketones, ur NEGATIVE NEGATIVE mg/dL   Protein, ur NEGATIVE NEGATIVE mg/dL   Nitrite NEGATIVE NEGATIVE   Leukocytes,Ua MODERATE (A) NEGATIVE  Pregnancy, urine     Status: None   Collection Time: 05/14/22 12:42 AM  Result Value Ref Range   Preg Test, Ur NEGATIVE NEGATIVE  Urinalysis, Microscopic (reflex)     Status: Abnormal   Collection Time: 05/14/22 12:42 AM  Result Value Ref Range   RBC / HPF 0-5 0 - 5 RBC/hpf   WBC, UA 11-20 0 - 5 WBC/hpf   Bacteria, UA FEW (A) NONE SEEN   Squamous Epithelial / LPF 6-10 0 - 5   Imaging Studies: DG Lumbar Spine Complete  Result Date: 05/14/2022 CLINICAL DATA:  Back pain. EXAM: LUMBAR SPINE - COMPLETE 4+ VIEW COMPARISON:  CT abdomen pelvis dated 12/23/2021. FINDINGS: The L5 is transitional. No acute fracture or subluxation of the lumbar spine. The vertebral body heights are maintained. The visualized posterior elements are intact. The soft tissues are unremarkable. IMPRESSION: No acute findings. Electronically Signed   By: Elgie Collard M.D.   On: 05/14/2022 01:20    ED COURSE and MDM  Nursing notes, initial and subsequent vitals signs, including pulse oximetry, reviewed and interpreted by myself.  Vitals:   05/13/22 2212 05/13/22 2213  BP: 133/73   Pulse: (!) 113   Resp: 20   Temp: 98.4 F (36.9 C)   TempSrc: Oral   SpO2: 97%   Weight:  105.2 kg  Height:  5\' 3"  (1.6 m)   Medications  fosfomycin (MONUROL) packet 3 g (has no administration in time range)   1:30 AM The patient's urinalysis is consistent with a urinary tract infection and we will treat with fosfomycin.  The  patient does not wish a shot of analgesics (she does not like needles) and will pick up her prescriptions at the all-night pharmacy.  We will refer to neurosurgery as she likely needs a neurosurgical work-up, likely MRI, and possibly steroid injections.   PROCEDURES  Procedures   ED DIAGNOSES     ICD-10-CM   1. Lower urinary tract infectious disease  N39.0     2. Acute midline low back pain without sciatica  M54.50          Lucero Ide, , MD 05/14/22 [phone]

## 2022-05-14 ENCOUNTER — Emergency Department (HOSPITAL_BASED_OUTPATIENT_CLINIC_OR_DEPARTMENT_OTHER): Payer: Self-pay

## 2022-05-14 LAB — URINALYSIS, ROUTINE W REFLEX MICROSCOPIC
Bilirubin Urine: NEGATIVE
Glucose, UA: NEGATIVE mg/dL
Hgb urine dipstick: NEGATIVE
Ketones, ur: NEGATIVE mg/dL
Nitrite: NEGATIVE
Protein, ur: NEGATIVE mg/dL
Specific Gravity, Urine: <=1.005 (ref 1.005–1.030)
pH: 7 (ref 5.0–8.0)

## 2022-05-14 LAB — URINALYSIS, MICROSCOPIC (REFLEX)

## 2022-05-14 LAB — PREGNANCY, URINE: Preg Test, Ur: NEGATIVE

## 2022-05-14 MED ORDER — HYDROMORPHONE HCL 2 MG PO TABS: 2.0000 mg | ORAL_TABLET | ORAL | 0 refills | Status: DC | PRN

## 2022-05-14 MED ORDER — CYCLOBENZAPRINE HCL 10 MG PO TABS: 10.0000 mg | ORAL_TABLET | Freq: Three times a day (TID) | ORAL | 0 refills | Status: DC | PRN

## 2022-05-14 MED ORDER — OXYCODONE-ACETAMINOPHEN 10-325 MG PO TABS: 1.0000 | ORAL_TABLET | Freq: Four times a day (QID) | ORAL | 0 refills | Status: DC | PRN

## 2022-05-14 MED ORDER — FOSFOMYCIN TROMETHAMINE 3 G PO PACK
3.0000 g | PACK | Freq: Once | ORAL | Status: AC
  Administered 2022-05-14: 3 g via ORAL
  Filled 2022-05-14: qty 3

## 2022-07-17 ENCOUNTER — Other Ambulatory Visit: Payer: Self-pay

## 2022-07-17 ENCOUNTER — Encounter (HOSPITAL_BASED_OUTPATIENT_CLINIC_OR_DEPARTMENT_OTHER): Payer: Self-pay | Admitting: Emergency Medicine

## 2022-07-17 ENCOUNTER — Emergency Department (HOSPITAL_BASED_OUTPATIENT_CLINIC_OR_DEPARTMENT_OTHER): Payer: Self-pay

## 2022-07-17 ENCOUNTER — Emergency Department (HOSPITAL_BASED_OUTPATIENT_CLINIC_OR_DEPARTMENT_OTHER)
Admission: EM | Admit: 2022-07-17 | Discharge: 2022-07-17 | Disposition: A | Discharge status: Home / Self Care | Payer: Self-pay | Attending: Emergency Medicine | Admitting: Emergency Medicine

## 2022-07-17 DIAGNOSIS — R109 Unspecified abdominal pain: Secondary | ICD-10-CM | POA: Insufficient documentation

## 2022-07-17 DIAGNOSIS — M545 Low back pain, unspecified: Secondary | ICD-10-CM | POA: Insufficient documentation

## 2022-07-17 DIAGNOSIS — M533 Sacrococcygeal disorders, not elsewhere classified: Secondary | ICD-10-CM | POA: Insufficient documentation

## 2022-07-17 DIAGNOSIS — D72829 Elevated white blood cell count, unspecified: Secondary | ICD-10-CM | POA: Insufficient documentation

## 2022-07-17 LAB — URINALYSIS, ROUTINE W REFLEX MICROSCOPIC
Bilirubin Urine: NEGATIVE
Glucose, UA: NEGATIVE mg/dL
Ketones, ur: NEGATIVE mg/dL
Leukocytes,Ua: NEGATIVE
Nitrite: NEGATIVE
Protein, ur: NEGATIVE mg/dL
Specific Gravity, Urine: 1.02 (ref 1.005–1.030)
pH: 7 (ref 5.0–8.0)

## 2022-07-17 LAB — CBC WITH DIFFERENTIAL/PLATELET
Abs Immature Granulocytes: 0.05 10*3/uL (ref 0.00–0.07)
Basophils Absolute: 0.1 10*3/uL (ref 0.0–0.1)
Basophils Relative: 1 %
Eosinophils Absolute: 0.1 10*3/uL (ref 0.0–0.5)
Eosinophils Relative: 1 %
HCT: 43 % (ref 36.0–46.0)
Hemoglobin: 14.7 g/dL (ref 12.0–15.0)
Immature Granulocytes: 0 %
Lymphocytes Relative: 28 %
Lymphs Abs: 3.7 10*3/uL (ref 0.7–4.0)
MCH: 31.1 pg (ref 26.0–34.0)
MCHC: 34.2 g/dL (ref 30.0–36.0)
MCV: 91.1 fL (ref 80.0–100.0)
Monocytes Absolute: 0.8 10*3/uL (ref 0.1–1.0)
Monocytes Relative: 6 %
Neutro Abs: 8.6 10*3/uL — ABNORMAL HIGH (ref 1.7–7.7)
Neutrophils Relative %: 64 %
Platelets: 316 10*3/uL (ref 150–400)
RBC: 4.72 MIL/uL (ref 3.87–5.11)
RDW: 13 % (ref 11.5–15.5)
WBC: 13.3 10*3/uL — ABNORMAL HIGH (ref 4.0–10.5)
nRBC: 0 % (ref 0.0–0.2)

## 2022-07-17 LAB — BASIC METABOLIC PANEL
Anion gap: 8 (ref 5–15)
BUN: 13 mg/dL (ref 6–20)
CO2: 22 mmol/L (ref 22–32)
Calcium: 9 mg/dL (ref 8.9–10.3)
Chloride: 106 mmol/L (ref 98–111)
Creatinine, Ser: 0.95 mg/dL (ref 0.44–1.00)
GFR, Estimated: >60 mL/min (ref >60)
Glucose, Bld: 133 mg/dL — ABNORMAL HIGH (ref 70–99)
Potassium: 4 mmol/L (ref 3.5–5.1)
Sodium: 136 mmol/L (ref 135–145)

## 2022-07-17 LAB — URINALYSIS, MICROSCOPIC (REFLEX)

## 2022-07-17 LAB — PREGNANCY, URINE: Preg Test, Ur: NEGATIVE

## 2022-07-17 MED ORDER — KETOROLAC TROMETHAMINE 30 MG/ML IJ SOLN
30.0000 mg | Freq: Once | INTRAMUSCULAR | Status: AC
  Administered 2022-07-17: 30 mg via INTRAVENOUS
  Filled 2022-07-17: qty 1

## 2022-07-17 MED ORDER — METHOCARBAMOL 500 MG PO TABS
1000.0000 mg | ORAL_TABLET | Freq: Once | ORAL | Status: AC
  Administered 2022-07-17: 1000 mg via ORAL
  Filled 2022-07-17: qty 2

## 2022-07-17 MED ORDER — DICLOFENAC SODIUM ER 100 MG PO TB24: 100.0000 mg | ORAL_TABLET | Freq: Every day | ORAL | 0 refills | Status: DC

## 2022-07-17 MED ORDER — METHOCARBAMOL 500 MG PO TABS: 500.0000 mg | ORAL_TABLET | Freq: Two times a day (BID) | ORAL | 0 refills | Status: DC

## 2022-07-17 MED ORDER — LIDOCAINE 5 % EX PTCH
3.0000 | MEDICATED_PATCH | CUTANEOUS | Status: DC
  Administered 2022-07-17: 3 via TRANSDERMAL
  Filled 2022-07-17: qty 3

## 2022-07-17 MED ORDER — PREDNISONE 20 MG PO TABS: ORAL_TABLET | ORAL | 0 refills | Status: DC

## 2022-07-17 NOTE — ED Triage Notes (Signed)
Patient arrived via POV c/o chronic back pain x 1 month. Patient states seen previously for same, with no relief. Patient states increased pain with "crunching noise" when walking, sitting, standing. Patient is AO x 4, VS with elevated Temp, slow gait.

## 2022-07-17 NOTE — ED Provider Notes (Signed)
[facility] EMERGENCY DEPARTMENT Provider Note   CSN: [id number] Arrival date & time: 07/17/22  0009     History  Chief Complaint  Patient presents with   Back Pain    Patty Wilcox is a 26 y.o. female.  The history is provided by the patient.  Back Pain Location:  Sacro-iliac joint Pain severity:  Severe Pain is:  Same all the time Onset quality:  Gradual Duration: since 26 YO worse in the past month. Timing:  Constant Progression:  Worsening Chronicity:  Chronic Context: not falling   Relieved by:  Nothing Worsened by:  Nothing Ineffective treatments: oral narcotics and flexeril. Associated symptoms: no dysuria and no fever   Risk factors: no steroid use   Patient with a h/o back pain and kidney stones presents with low back pain.  No recent trauma.  Has been unable to follow up with back specialist.      Past Medical History:  Diagnosis Date   Anxiety    Anxiety    Depression    Obesity    Thyroid disease    Ureterolithiasis     Home Medications Prior to Admission medications   Medication Sig Start Date End Date Taking? Authorizing Provider  Diclofenac Sodium CR 100 MG 24 hr tablet Take 1 tablet (100 mg total) by mouth daily. 07/17/22  Yes Zillah Alexie, MD  methocarbamol (ROBAXIN) 500 MG tablet Take 1 tablet (500 mg total) by mouth 2 (two) times daily. 07/17/22  Yes Charliene Inoue, MD  predniSONE (DELTASONE) 20 MG tablet 2 tabs po daily x 4 days 07/17/22  Yes Jazz Biddy, MD  aspirin-acetaminophen-caffeine (EXCEDRIN MIGRAINE) [phone] MG tablet Take 2 tablets by mouth every 6 (six) hours as needed for headache.    [provider]  cyclobenzaprine (FLEXERIL) 10 MG tablet Take 1 tablet (10 mg total) by mouth 3 (three) times daily as needed for muscle spasms. 05/14/22   Molpus, John, MD  HYDROmorphone (DILAUDID) 2 MG tablet Take 1 tablet (2 mg total) by mouth every 4 (four) hours as needed for severe pain. 05/14/22   Molpus, John, MD   ondansetron (ZOFRAN-ODT) 4 MG disintegrating tablet 4mg  ODT q4 hours prn nausea/vomit 12/23/21   12/25/21, MD  oxyCODONE-acetaminophen (PERCOCET) 10-325 MG tablet Take 1 tablet by mouth every 6 (six) hours as needed for pain. 05/14/22   Molpus, 05/16/22, MD      Allergies    Other    Review of Systems   Review of Systems  Constitutional:  Negative for fever.  HENT:  Positive for facial swelling.   Respiratory:  Negative for wheezing and stridor.   Gastrointestinal:  Negative for vomiting.  Genitourinary:  Negative for difficulty urinating and dysuria.  Musculoskeletal:  Positive for back pain.  All other systems reviewed and are negative.   Physical Exam Updated Vital Signs BP (!) 139/98   Pulse 95   Temp 99 F (37.2 C) (Oral)   Resp 18   Ht 5\' 3"  (1.6 m)   Wt 101.6 kg   LMP 06/24/2022 (Approximate)   SpO2 99%   BMI 39.68 kg/m  Physical Exam Vitals and nursing note reviewed. Exam conducted with a chaperone present.  Constitutional:      General: She is not in acute distress.    Appearance: She is well-developed.  HENT:     Head: Normocephalic and atraumatic.     Nose: Nose normal.  Eyes:     Pupils: Pupils are equal, round, and reactive to  light.  Cardiovascular:     Rate and Rhythm: Normal rate and regular rhythm.     Pulses: Normal pulses.     Heart sounds: Normal heart sounds.  Pulmonary:     Effort: Pulmonary effort is normal. No respiratory distress.     Breath sounds: Normal breath sounds.  Abdominal:     General: Bowel sounds are normal. There is no distension.     Palpations: Abdomen is soft.     Tenderness: There is no abdominal tenderness. There is no guarding or rebound.  Genitourinary:    Vagina: No vaginal discharge.  Musculoskeletal:        General: Normal range of motion.     Cervical back: Normal and neck supple.     Thoracic back: Normal.     Lumbar back: Normal.  Skin:    General: Skin is warm and dry.     Capillary Refill: Capillary  refill takes less than 2 seconds.     Findings: No erythema or rash.  Neurological:     General: No focal deficit present.     Deep Tendon Reflexes: Reflexes normal.  Psychiatric:        Mood and Affect: Mood normal.     ED Results / Procedures / Treatments   Labs (all labs ordered are listed, but only abnormal results are displayed) Results for orders placed or performed during the hospital encounter of 07/17/22  CBC with Differential  Result Value Ref Range   WBC 13.3 (H) 4.0 - 10.5 K/uL   RBC 4.72 3.87 - 5.11 MIL/uL   Hemoglobin 14.7 12.0 - 15.0 g/dL   HCT 42.7 06.2 - 37.6 %   MCV 91.1 80.0 - 100.0 fL   MCH 31.1 26.0 - 34.0 pg   MCHC 34.2 30.0 - 36.0 g/dL   RDW 28.3 15.1 - 76.1 %   Platelets 316 150 - 400 K/uL   nRBC 0.0 0.0 - 0.2 %   Neutrophils Relative % 64 %   Neutro Abs 8.6 (H) 1.7 - 7.7 K/uL   Lymphocytes Relative 28 %   Lymphs Abs 3.7 0.7 - 4.0 K/uL   Monocytes Relative 6 %   Monocytes Absolute 0.8 0.1 - 1.0 K/uL   Eosinophils Relative 1 %   Eosinophils Absolute 0.1 0.0 - 0.5 K/uL   Basophils Relative 1 %   Basophils Absolute 0.1 0.0 - 0.1 K/uL   Immature Granulocytes 0 %   Abs Immature Granulocytes 0.05 0.00 - 0.07 K/uL  Basic metabolic panel  Result Value Ref Range   Sodium 136 135 - 145 mmol/L   Potassium 4.0 3.5 - 5.1 mmol/L   Chloride 106 98 - 111 mmol/L   CO2 22 22 - 32 mmol/L   Glucose, Bld 133 (H) 70 - 99 mg/dL   BUN 13 6 - 20 mg/dL   Creatinine, Ser 6.07 0.44 - 1.00 mg/dL   Calcium 9.0 8.9 - 37.1 mg/dL   GFR, Estimated >06 >26 mL/min   Anion gap 8 5 - 15  Urinalysis, Routine w reflex microscopic Urine, Clean Catch  Result Value Ref Range   Color, Urine YELLOW YELLOW   APPearance CLOUDY (A) CLEAR   Specific Gravity, Urine 1.020 1.005 - 1.030   pH 7.0 5.0 - 8.0   Glucose, UA NEGATIVE NEGATIVE mg/dL   Hgb urine dipstick TRACE (A) NEGATIVE   Bilirubin Urine NEGATIVE NEGATIVE   Ketones, ur NEGATIVE NEGATIVE mg/dL   Protein, ur NEGATIVE  NEGATIVE mg/dL   Nitrite NEGATIVE  NEGATIVE   Leukocytes,Ua NEGATIVE NEGATIVE  Pregnancy, urine  Result Value Ref Range   Preg Test, Ur NEGATIVE NEGATIVE  Urinalysis, Microscopic (reflex)  Result Value Ref Range   RBC / HPF 0-5 0 - 5 RBC/hpf   WBC, UA 0-5 0 - 5 WBC/hpf   Bacteria, UA FEW (A) NONE SEEN   Squamous Epithelial / LPF 11-20 0 - 5   Mucus PRESENT    CT Renal Stone Study  Result Date: 07/17/2022 CLINICAL DATA:  Flank pain, kidney stone suspected (pregnant) EXAM: CT ABDOMEN AND PELVIS WITHOUT CONTRAST TECHNIQUE: Multidetector CT imaging of the abdomen and pelvis was performed following the standard protocol without IV contrast. RADIATION DOSE REDUCTION: This exam was performed according to the departmental dose-optimization program which includes automated exposure control, adjustment of the mA and/or kV according to patient size and/or use of iterative reconstruction technique. COMPARISON:  12/23/2021 FINDINGS: Lower chest: No acute abnormality. Hepatobiliary: Diffuse low-density throughout the liver compatible with fatty infiltration. No focal abnormality. Gallbladder unremarkable. Pancreas: No focal abnormality or ductal dilatation. Spleen: No focal abnormality.  Normal size. Adrenals/Urinary Tract: 2 mm nonobstructing stone in the lower pole of the left kidney. No ureteral stones or hydronephrosis. Adrenal glands and urinary bladder unremarkable. Stomach/Bowel: Normal appendix. Stomach, large and small bowel grossly unremarkable. Vascular/Lymphatic: No evidence of aneurysm or adenopathy. Reproductive: Uterus and adnexa unremarkable.  No mass. Other: No free fluid or free air. Musculoskeletal: No acute bony abnormality. IMPRESSION: Fatty liver. Punctate left lower pole nephrolithiasis. No ureteral stones or hydronephrosis. No acute findings. Electronically Signed   By: Charlett Nose M.D.   On: 07/17/2022 01:08      Radiology CT Renal Stone Study  Result Date: 07/17/2022 CLINICAL DATA:   Flank pain, kidney stone suspected (pregnant) EXAM: CT ABDOMEN AND PELVIS WITHOUT CONTRAST TECHNIQUE: Multidetector CT imaging of the abdomen and pelvis was performed following the standard protocol without IV contrast. RADIATION DOSE REDUCTION: This exam was performed according to the departmental dose-optimization program which includes automated exposure control, adjustment of the mA and/or kV according to patient size and/or use of iterative reconstruction technique. COMPARISON:  12/23/2021 FINDINGS: Lower chest: No acute abnormality. Hepatobiliary: Diffuse low-density throughout the liver compatible with fatty infiltration. No focal abnormality. Gallbladder unremarkable. Pancreas: No focal abnormality or ductal dilatation. Spleen: No focal abnormality.  Normal size. Adrenals/Urinary Tract: 2 mm nonobstructing stone in the lower pole of the left kidney. No ureteral stones or hydronephrosis. Adrenal glands and urinary bladder unremarkable. Stomach/Bowel: Normal appendix. Stomach, large and small bowel grossly unremarkable. Vascular/Lymphatic: No evidence of aneurysm or adenopathy. Reproductive: Uterus and adnexa unremarkable.  No mass. Other: No free fluid or free air. Musculoskeletal: No acute bony abnormality. IMPRESSION: Fatty liver. Punctate left lower pole nephrolithiasis. No ureteral stones or hydronephrosis. No acute findings. Electronically Signed   By: Charlett Nose M.D.   On: 07/17/2022 01:08    Procedures Procedures    Medications Ordered in ED Medications  lidocaine (LIDODERM) 5 % 3 patch (3 patches Transdermal Patch Applied 07/17/22 0108)  ketorolac (TORADOL) 30 MG/ML injection 30 mg (30 mg Intravenous Given 07/17/22 0107)  methocarbamol (ROBAXIN) tablet 1,000 mg (1,000 mg Oral Given 07/17/22 0128)    ED Course/ Medical Decision Making/ A&P                           Medical Decision Making Patient with low back pain and was told her low back was fusing   Amount  and/or Complexity of  Data Reviewed Independent Historian: friend    Details: See above  External Data Reviewed: notes.    Details: Previous notes reviewed  Labs: ordered.    Details: All labs reviewed: negative pregnancy test.  Urine negative for UTI.  White count slightly elevated at 13.3, .  Normal hemoglobin and platelet count 313k.  Normal Sodium 136, and creatinine.   Radiology: ordered and independent interpretation performed.    Details: No stones by me on CT Discussion of management or test interpretation with external provider(s): EDP called Dr. Kearney Hard to review bones of the spine on CT and there is no fusing of the bones of the spine in the L spine or as high as the T spine on CT  Risk Prescription drug management. Risk Details: Patient with ongoing low back pain.  EDP reassured patient and family member there is no fusion at this time and that I called radiology to confirm this.  I recommend close follow up with a spine specialist.  I spoke at length with patient and her family member regarding findings and care.  I have explained medications sent to pharmacy. Nurse was present for the entirety of this interaction.  Stable for discharge.     Final Clinical Impression(s) / ED Diagnoses Final diagnoses:  Low back pain without sciatica, unspecified back pain laterality, unspecified chronicity  Return for intractable cough, coughing up blood, fevers > 100.4 unrelieved by medication, shortness of breath, intractable vomiting, chest pain, shortness of breath, weakness, numbness, changes in speech, facial asymmetry, abdominal pain, passing out, Inability to tolerate liquids or food, cough, altered mental status or any concerns. No signs of systemic illness or infection. The patient is nontoxic-appearing on exam and vital signs are within normal limits.  I have reviewed the triage vital signs and the nursing notes. Pertinent labs & imaging results that were available during my care of the patient were reviewed by  me and considered in my medical decision making (see chart for details). After history, exam, and medical workup I feel the patient has been appropriately medically screened and is safe for discharge home. Pertinent diagnoses were discussed with the patient. Patient was given return precautions.   Rx / DC Orders ED Discharge Orders          Ordered    Diclofenac Sodium CR 100 MG 24 hr tablet  Daily        07/17/22 0121    methocarbamol (ROBAXIN) 500 MG tablet  2 times daily        07/17/22 0121    predniSONE (DELTASONE) 20 MG tablet        07/17/22 0121              Musette Kisamore, MD 07/17/22 9147

## 2022-10-16 ENCOUNTER — Encounter (HOSPITAL_BASED_OUTPATIENT_CLINIC_OR_DEPARTMENT_OTHER): Payer: Self-pay | Admitting: Emergency Medicine

## 2022-10-16 ENCOUNTER — Emergency Department (HOSPITAL_BASED_OUTPATIENT_CLINIC_OR_DEPARTMENT_OTHER)
Admission: EM | Admit: 2022-10-16 | Discharge: 2022-10-16 | Disposition: A | Discharge status: Home / Self Care | Payer: No Typology Code available for payment source | Attending: Emergency Medicine | Admitting: Emergency Medicine

## 2022-10-16 DIAGNOSIS — Z1152 Encounter for screening for COVID-19: Secondary | ICD-10-CM | POA: Diagnosis not present

## 2022-10-16 DIAGNOSIS — J069 Acute upper respiratory infection, unspecified: Secondary | ICD-10-CM | POA: Diagnosis not present

## 2022-10-16 DIAGNOSIS — R059 Cough, unspecified: Secondary | ICD-10-CM | POA: Diagnosis present

## 2022-10-16 LAB — RESP PANEL BY RT-PCR (FLU A&B, COVID) ARPGX2
Influenza A by PCR: NEGATIVE
Influenza B by PCR: NEGATIVE
SARS Coronavirus 2 by RT PCR: NEGATIVE

## 2022-10-16 LAB — GROUP A STREP BY PCR: Group A Strep by PCR: NOT DETECTED

## 2022-10-16 MED ORDER — DEXAMETHASONE SODIUM PHOSPHATE 10 MG/ML IJ SOLN
10.0000 mg | Freq: Once | INTRAMUSCULAR | Status: AC
  Administered 2022-10-16: 10 mg via INTRAMUSCULAR
  Filled 2022-10-16: qty 1

## 2022-10-16 MED ORDER — BENZONATATE 200 MG PO CAPS: 200.0000 mg | ORAL_CAPSULE | Freq: Three times a day (TID) | ORAL | 0 refills | Status: AC

## 2022-10-16 NOTE — ED Provider Notes (Signed)
[facility] EMERGENCY DEPARTMENT Provider Note   CSN: [id number] Arrival date & time: 10/16/22  1344     History  Chief Complaint  Patient presents with   Sore Throat    Patty Wilcox is a 26 y.o. female.   26 year old female presents with concern for sore throat, cough, congestion, body aches with report of white spots in her throat for the past 24 hours.  No known sick contacts.  Is taking DayQuil/NyQuil with relief.  Denies fevers.       Home Medications Prior to Admission medications   Medication Sig Start Date End Date Taking? Authorizing Provider  benzonatate (TESSALON) 200 MG capsule Take 1 capsule (200 mg total) by mouth every 8 (eight) hours for 10 days. 10/16/22 10/26/22 Yes Jeannie Fend, PA-C  aspirin-acetaminophen-caffeine (EXCEDRIN MIGRAINE) [phone] MG tablet Take 2 tablets by mouth every 6 (six) hours as needed for headache.    [provider]  cyclobenzaprine (FLEXERIL) 10 MG tablet Take 1 tablet (10 mg total) by mouth 3 (three) times daily as needed for muscle spasms. 05/14/22   Molpus, John, MD  Diclofenac Sodium CR 100 MG 24 hr tablet Take 1 tablet (100 mg total) by mouth daily. 07/17/22   Palumbo, April, MD  HYDROmorphone (DILAUDID) 2 MG tablet Take 1 tablet (2 mg total) by mouth every 4 (four) hours as needed for severe pain. 05/14/22   Molpus, John, MD  methocarbamol (ROBAXIN) 500 MG tablet Take 1 tablet (500 mg total) by mouth 2 (two) times daily. 07/17/22   Palumbo, April, MD  ondansetron (ZOFRAN-ODT) 4 MG disintegrating tablet 4mg  ODT q4 hours prn nausea/vomit 12/23/21   12/25/21, MD  oxyCODONE-acetaminophen (PERCOCET) 10-325 MG tablet Take 1 tablet by mouth every 6 (six) hours as needed for pain. 05/14/22   Molpus, 05/16/22, MD  predniSONE (DELTASONE) 20 MG tablet 2 tabs po daily x 4 days 07/17/22   07/19/22, April, MD      Allergies    Other    Review of Systems   Review of Systems Negative except as per HPI Physical Exam Updated  Vital Signs BP (!) 141/87 (BP Location: Right Arm)   Pulse (!) 106   Temp 98.4 F (36.9 C) (Oral)   Resp 17   Ht 5\' 3"  (1.6 m)   Wt 103.4 kg   LMP 09/15/2022 (Approximate)   SpO2 98%   BMI 40.39 kg/m  Physical Exam Vitals and nursing note reviewed.  Constitutional:      General: She is not in acute distress.    Appearance: She is well-developed. She is not diaphoretic.  HENT:     Head: Normocephalic and atraumatic.     Right Ear: Tympanic membrane and ear canal normal.     Left Ear: Tympanic membrane and ear canal normal.     Nose: Congestion present.     Mouth/Throat:     Mouth: Mucous membranes are moist. No oral lesions.     Pharynx: Uvula midline. No pharyngeal swelling, posterior oropharyngeal erythema or uvula swelling.     Tonsils: Tonsillar exudate present. 1+ on the right. 1+ on the left.  Eyes:     Conjunctiva/sclera: Conjunctivae normal.  Cardiovascular:     Rate and Rhythm: Normal rate and regular rhythm.     Heart sounds: Normal heart sounds. No murmur heard. Pulmonary:     Effort: Pulmonary effort is normal.     Breath sounds: Normal breath sounds.  Musculoskeletal:     Cervical back: Neck  supple.  Lymphadenopathy:     Cervical: No cervical adenopathy.  Skin:    General: Skin is warm and dry.     Findings: No erythema or rash.  Neurological:     Mental Status: She is alert and oriented to person, place, and time.  Psychiatric:        Behavior: Behavior normal.     ED Results / Procedures / Treatments   Labs (all labs ordered are listed, but only abnormal results are displayed) Labs Reviewed  GROUP A STREP BY PCR  RESP PANEL BY RT-PCR (FLU A&B, COVID) [id number]    EKG None  Radiology No results found.  Procedures Procedures    Medications Ordered in ED Medications  dexamethasone (DECADRON) injection 10 mg (10 mg Intramuscular Given 10/16/22 1530)    ED Course/ Medical Decision Making/ A&P                           Medical Decision  Making  26 year old female with concern as above.  On exam does have congestion, very mildly enlarged tonsils with white exudate.  No tender cervical lymphadenopathy.  Lungs clear to auscultation.  Patient is gated for strep, COVID, flu.  Discussed likely viral illness.  Recommend Decadron today to help with sore throat.  Can continue with Tessalon as prescribed for cough.  Home to hydrate, Motrin Tylenol as needed for symptomatic relief.  Recheck with PCP if not improving.        Final Clinical Impression(s) / ED Diagnoses Final diagnoses:  Viral upper respiratory tract infection    Rx / DC Orders ED Discharge Orders          Ordered    benzonatate (TESSALON) 200 MG capsule  Every 8 hours        10/16/22 1520              Jeannie Fend, PA-C 10/16/22 1538    Theresia Lo, Chrisman K, DO 10/16/22 2311

## 2022-10-16 NOTE — Discharge Instructions (Signed)
Recheck with your primary care provider if symptoms or not improving.  Return to ER for severe or concerning symptoms.  Tessalon as needed as prescribed for cough. Motrin and Tylenol as instructed for throat pain.  Also recommend hydrate, can drink warm tea with honey or gargle warm salt water. Also recommend Flonase and Zyrtec daily.

## 2022-10-16 NOTE — ED Triage Notes (Signed)
Sore throat, intermittent fevers, cough, generalized body aches, and "white spots" in the back of throat x 24 hrs.

## 2022-11-18 DIAGNOSIS — F4541 Pain disorder exclusively related to psychological factors: Secondary | ICD-10-CM | POA: Diagnosis not present

## 2022-11-18 DIAGNOSIS — Z1152 Encounter for screening for COVID-19: Secondary | ICD-10-CM | POA: Diagnosis not present

## 2022-11-18 DIAGNOSIS — Z87891 Personal history of nicotine dependence: Secondary | ICD-10-CM | POA: Insufficient documentation

## 2022-11-18 DIAGNOSIS — R6889 Other general symptoms and signs: Secondary | ICD-10-CM | POA: Insufficient documentation

## 2022-11-19 ENCOUNTER — Other Ambulatory Visit: Payer: Self-pay

## 2022-11-19 ENCOUNTER — Emergency Department (HOSPITAL_COMMUNITY)
Admission: EM | Admit: 2022-11-19 | Discharge: 2022-11-19 | Disposition: A | Discharge status: Home / Self Care | Payer: No Typology Code available for payment source | Attending: Emergency Medicine | Admitting: Emergency Medicine

## 2022-11-19 ENCOUNTER — Encounter (HOSPITAL_COMMUNITY): Payer: Self-pay

## 2022-11-19 DIAGNOSIS — R6889 Other general symptoms and signs: Secondary | ICD-10-CM | POA: Diagnosis not present

## 2022-11-19 LAB — CBC WITH DIFFERENTIAL/PLATELET
Abs Immature Granulocytes: 0.04 10*3/uL (ref 0.00–0.07)
Basophils Absolute: 0.1 10*3/uL (ref 0.0–0.1)
Basophils Relative: 1 %
Eosinophils Absolute: 0.2 10*3/uL (ref 0.0–0.5)
Eosinophils Relative: 2 %
HCT: 43.1 % (ref 36.0–46.0)
Hemoglobin: 14.3 g/dL (ref 12.0–15.0)
Immature Granulocytes: 0 %
Lymphocytes Relative: 21 %
Lymphs Abs: 2.3 10*3/uL (ref 0.7–4.0)
MCH: 30.2 pg (ref 26.0–34.0)
MCHC: 33.2 g/dL (ref 30.0–36.0)
MCV: 91.1 fL (ref 80.0–100.0)
Monocytes Absolute: 0.8 10*3/uL (ref 0.1–1.0)
Monocytes Relative: 7 %
Neutro Abs: 7.4 10*3/uL (ref 1.7–7.7)
Neutrophils Relative %: 69 %
Platelets: 325 10*3/uL (ref 150–400)
RBC: 4.73 MIL/uL (ref 3.87–5.11)
RDW: 12.5 % (ref 11.5–15.5)
WBC: 10.9 10*3/uL — ABNORMAL HIGH (ref 4.0–10.5)
nRBC: 0 % (ref 0.0–0.2)

## 2022-11-19 LAB — TSH: TSH: 5.774 u[IU]/mL — ABNORMAL HIGH (ref 0.350–4.500)

## 2022-11-19 LAB — COMPREHENSIVE METABOLIC PANEL
ALT: 58 U/L — ABNORMAL HIGH (ref 0–44)
AST: 46 U/L — ABNORMAL HIGH (ref 15–41)
Albumin: 4.5 g/dL (ref 3.5–5.0)
Alkaline Phosphatase: 105 U/L (ref 38–126)
Anion gap: 8 (ref 5–15)
BUN: 8 mg/dL (ref 6–20)
CO2: 24 mmol/L (ref 22–32)
Calcium: 9.6 mg/dL (ref 8.9–10.3)
Chloride: 104 mmol/L (ref 98–111)
Creatinine, Ser: 0.89 mg/dL (ref 0.44–1.00)
GFR, Estimated: >60 mL/min (ref >60)
Glucose, Bld: 129 mg/dL — ABNORMAL HIGH (ref 70–99)
Potassium: 3.9 mmol/L (ref 3.5–5.1)
Sodium: 136 mmol/L (ref 135–145)
Total Bilirubin: 0.5 mg/dL (ref 0.3–1.2)
Total Protein: 8.5 g/dL — ABNORMAL HIGH (ref 6.5–8.1)

## 2022-11-19 LAB — URINALYSIS, ROUTINE W REFLEX MICROSCOPIC
Bilirubin Urine: NEGATIVE
Glucose, UA: NEGATIVE mg/dL
Ketones, ur: NEGATIVE mg/dL
Leukocytes,Ua: NEGATIVE
Nitrite: NEGATIVE
Protein, ur: NEGATIVE mg/dL
Specific Gravity, Urine: 1.006 (ref 1.005–1.030)
pH: 6 (ref 5.0–8.0)

## 2022-11-19 LAB — RESP PANEL BY RT-PCR (RSV, FLU A&B, COVID)  RVPGX2
Influenza A by PCR: NEGATIVE
Influenza B by PCR: NEGATIVE
Resp Syncytial Virus by PCR: NEGATIVE
SARS Coronavirus 2 by RT PCR: NEGATIVE

## 2022-11-19 LAB — I-STAT BETA HCG BLOOD, ED (MC, WL, AP ONLY): I-stat hCG, quantitative: <5 m[IU]/mL (ref <5)

## 2022-11-19 NOTE — ED Provider Notes (Signed)
[facility]-EMERGENCY DEPT Provider Note: Patty Dell, MD, FACEP  CSN: [id number] MRN: [id number] ARRIVAL: 11/18/22 at 2347 ROOM: WA13/WA13   CHIEF COMPLAINT  Multiple Complaints    HISTORY OF PRESENT ILLNESS  11/19/22 4:21 AM Patty Wilcox is a 26 y.o. female who has been diagnosed in the past with chronic back pain, anxiety, depression and borderline personality disorder.  She admits she was a heavy drinker since late 2019.  She was drinking deal with her psychiatric as well as somatic pain.  About 2 weeks ago she successfully stopped drinking altogether and states she has had no alcohol since then.  For about the last year she has become aware of the following symptoms:    And from the backside:  -> Shortness of breath -> Feeling lethargic  She does not have all of the symptoms every day, and in fact some symptoms are infrequent and some are rather frequent.  She found out yesterday that she does in fact have Medicaid.  Now that she has some form of insurance she would like to get help for these chronic conditions.  She does not have a specific acute complaint.  She does have a questionable history of thyroid disease.  She states she was told by a doctor in [place] that she has some type of thyroid problem but because she was heavily drinking at the time she did not pursue this.   Past Medical History:  Diagnosis Date   Anxiety    Anxiety    Depression    Obesity    Thyroid disease    Ureterolithiasis     Past Surgical History:  Procedure Laterality Date   NO PAST SURGERIES      Family History  Problem Relation Age of Onset   Mental illness Mother    Drug abuse Mother    Depression Father    Hypertension Father     Social History   Tobacco Use   Smoking status: Former    Packs/day: 0.15    Years: 4.00    Total pack years: 0.60    Types: Cigarettes   Smokeless tobacco: Never  Vaping Use   Vaping Use: Every day   Substances: Nicotine, Flavoring  Substance Use  Topics   Alcohol use: Yes    Comment: occ   Drug use: Not Currently    Types: Marijuana    Comment: Twice per year    Prior to Admission medications   Medication Sig Start Date End Date Taking? Authorizing Provider  aspirin-acetaminophen-caffeine (EXCEDRIN MIGRAINE) [phone] MG tablet Take 2 tablets by mouth every 6 (six) hours as needed for headache.    [provider]  oxyCODONE-acetaminophen (PERCOCET) 10-325 MG tablet Take 1 tablet by mouth every 6 (six) hours as needed for pain. 05/14/22   Allyne Hebert, MD    Allergies Other   REVIEW OF SYSTEMS  Negative except as noted here or in the History of Present Illness.   PHYSICAL EXAMINATION  Initial Vital Signs Blood pressure 138/82, pulse 99, temperature 98.3 F (36.8 C), temperature source Oral, resp. rate 18, height 5\' 3"  (1.6 m), weight 102.1 kg, SpO2 95 %.  Examination General: Well-developed, well-nourished female in no acute distress; appearance consistent with age of record HENT: normocephalic; atraumatic Eyes: Normal appearance Neck: supple Heart: regular rate and rhythm Lungs: clear to auscultation bilaterally Abdomen: soft; nondistended; nontender; bowel sounds present Extremities: No deformity; full range of motion Neurologic: Awake, alert and oriented; motor function intact in all extremities and  symmetric; no facial droop Skin: Warm and dry Psychiatric: Mildly depressed mood without SI   RESULTS  Summary of this visit's results, reviewed and interpreted by myself:   EKG Interpretation  Date/Time:    Ventricular Rate:    PR Interval:    QRS Duration:   QT Interval:    QTC Calculation:   R Axis:     Text Interpretation:         Laboratory Studies: Results for orders placed or performed during the hospital encounter of 11/19/22 (from the past 24 hour(s))  CBC with Differential     Status: Abnormal   Collection Time: 11/19/22 12:59 AM  Result Value Ref Range   WBC 10.9 (H) 4.0 - 10.5  K/uL   RBC 4.73 3.87 - 5.11 MIL/uL   Hemoglobin 14.3 12.0 - 15.0 g/dL   HCT 67.6 72.0 - 94.7 %   MCV 91.1 80.0 - 100.0 fL   MCH 30.2 26.0 - 34.0 pg   MCHC 33.2 30.0 - 36.0 g/dL   RDW 09.6 28.3 - 66.2 %   Platelets 325 150 - 400 K/uL   nRBC 0.0 0.0 - 0.2 %   Neutrophils Relative % 69 %   Neutro Abs 7.4 1.7 - 7.7 K/uL   Lymphocytes Relative 21 %   Lymphs Abs 2.3 0.7 - 4.0 K/uL   Monocytes Relative 7 %   Monocytes Absolute 0.8 0.1 - 1.0 K/uL   Eosinophils Relative 2 %   Eosinophils Absolute 0.2 0.0 - 0.5 K/uL   Basophils Relative 1 %   Basophils Absolute 0.1 0.0 - 0.1 K/uL   Immature Granulocytes 0 %   Abs Immature Granulocytes 0.04 0.00 - 0.07 K/uL  Comprehensive metabolic panel     Status: Abnormal   Collection Time: 11/19/22 12:59 AM  Result Value Ref Range   Sodium 136 135 - 145 mmol/L   Potassium 3.9 3.5 - 5.1 mmol/L   Chloride 104 98 - 111 mmol/L   CO2 24 22 - 32 mmol/L   Glucose, Bld 129 (H) 70 - 99 mg/dL   BUN 8 6 - 20 mg/dL   Creatinine, Ser 9.47 0.44 - 1.00 mg/dL   Calcium 9.6 8.9 - 65.4 mg/dL   Total Protein 8.5 (H) 6.5 - 8.1 g/dL   Albumin 4.5 3.5 - 5.0 g/dL   AST 46 (H) 15 - 41 U/L   ALT 58 (H) 0 - 44 U/L   Alkaline Phosphatase 105 38 - 126 U/L   Total Bilirubin 0.5 0.3 - 1.2 mg/dL   GFR, Estimated >65 >03 mL/min   Anion gap 8 5 - 15  Urinalysis, Routine w reflex microscopic Anterior Nasal Swab     Status: Abnormal   Collection Time: 11/19/22  1:03 AM  Result Value Ref Range   Color, Urine YELLOW YELLOW   APPearance HAZY (A) CLEAR   Specific Gravity, Urine 1.006 1.005 - 1.030   pH 6.0 5.0 - 8.0   Glucose, UA NEGATIVE NEGATIVE mg/dL   Hgb urine dipstick MODERATE (A) NEGATIVE   Bilirubin Urine NEGATIVE NEGATIVE   Ketones, ur NEGATIVE NEGATIVE mg/dL   Protein, ur NEGATIVE NEGATIVE mg/dL   Nitrite NEGATIVE NEGATIVE   Leukocytes,Ua NEGATIVE NEGATIVE   RBC / HPF 0-5 0 - 5 RBC/hpf   WBC, UA 0-5 0 - 5 WBC/hpf   Bacteria, UA RARE (A) NONE SEEN   Squamous  Epithelial / LPF 0-5 0 - 5 /HPF   Mucus PRESENT    Hyaline Casts, UA PRESENT  Resp panel by RT-PCR (RSV, Flu A&B, Covid) Anterior Nasal Swab     Status: None   Collection Time: 11/19/22  1:04 AM   Specimen: Anterior Nasal Swab  Result Value Ref Range   SARS Coronavirus 2 by RT PCR NEGATIVE NEGATIVE   Influenza A by PCR NEGATIVE NEGATIVE   Influenza B by PCR NEGATIVE NEGATIVE   Resp Syncytial Virus by PCR NEGATIVE NEGATIVE  I-Stat Beta hCG blood, ED ([facility], [facility], [facility] only)     Status: None   Collection Time: 11/19/22  1:05 AM  Result Value Ref Range   I-stat hCG, quantitative <5.0 <5 mIU/mL   Comment 3           Imaging Studies: No results found.  ED COURSE and MDM  Nursing notes, initial and subsequent vitals signs, including pulse oximetry, reviewed and interpreted by myself.  Vitals:   11/19/22 0013 11/19/22 0046 11/19/22 0256  BP: (!) 126/91  138/82  Pulse: (!) 111  99  Resp: 18  18  Temp: 98.7 F (37.1 C)  98.3 F (36.8 C)  TempSrc:   Oral  SpO2: 100%  95%  Weight:  102.1 kg   Height:  5\' 3"  (1.6 m)    Medications - No data to display  There are no grossly abnormal lab studies except for mildly elevated transaminases consistent with the patient's history of heavy alcohol use.  We will draw TSH as a number of her symptoms could be related to hypothyroidism.  We will provide her a list of mental health resources as well as resources for finding a primary care physician.  She was advised that establishing with the a primary care physician would be the best thing she could do for her health at this time as the emergency department is a poor venue for dealing with chronic problems.  PROCEDURES  Procedures   ED DIAGNOSES     ICD-10-CM   1. Multiple somatic complaints  R68.89          Theran Vandergrift, , MD 11/19/22 [phone]

## 2022-11-19 NOTE — ED Triage Notes (Signed)
Pt reports with multiple complaints that have been going on for the past year. Pt states that she does not have a primary care provider or insurance. (Bloating, irritability, no energy, anxiety, etc...)

## 2022-11-19 NOTE — ED Notes (Signed)
Attempted to call pt to update her on labs. TSH 5.774 Dr Read Drivers stated she needs to follow up with her primary doctor for continued treatment. There was no answer, the voice mailbox was for Trinna Post therefore no message was left.

## 2022-12-15 ENCOUNTER — Ambulatory Visit: Payer: No Typology Code available for payment source | Admitting: Family Medicine

## 2022-12-15 ENCOUNTER — Encounter: Payer: Self-pay | Admitting: Family Medicine

## 2022-12-15 VITALS — BP 121/84 | HR 118 | Temp 98.7°F | Resp 16 | Ht 63.5 in | Wt 240.0 lb

## 2022-12-15 DIAGNOSIS — R7989 Other specified abnormal findings of blood chemistry: Secondary | ICD-10-CM | POA: Diagnosis not present

## 2022-12-15 DIAGNOSIS — Z6841 Body Mass Index (BMI) 40.0 and over, adult: Secondary | ICD-10-CM

## 2022-12-15 DIAGNOSIS — G4739 Other sleep apnea: Secondary | ICD-10-CM

## 2022-12-15 DIAGNOSIS — F909 Attention-deficit hyperactivity disorder, unspecified type: Secondary | ICD-10-CM | POA: Diagnosis not present

## 2022-12-15 DIAGNOSIS — E669 Obesity, unspecified: Secondary | ICD-10-CM | POA: Insufficient documentation

## 2022-12-15 DIAGNOSIS — E1165 Type 2 diabetes mellitus with hyperglycemia: Secondary | ICD-10-CM

## 2022-12-15 DIAGNOSIS — R000 Tachycardia, unspecified: Secondary | ICD-10-CM

## 2022-12-15 DIAGNOSIS — F32A Depression, unspecified: Secondary | ICD-10-CM

## 2022-12-15 DIAGNOSIS — F419 Anxiety disorder, unspecified: Secondary | ICD-10-CM

## 2022-12-15 DIAGNOSIS — R0789 Other chest pain: Secondary | ICD-10-CM | POA: Diagnosis not present

## 2022-12-15 HISTORY — DX: Attention-deficit hyperactivity disorder, unspecified type: F90.9

## 2022-12-15 LAB — CBC WITH DIFFERENTIAL/PLATELET
Basophils Absolute: 0.1 10*3/uL (ref 0.0–0.1)
Basophils Relative: 0.6 % (ref 0.0–3.0)
Eosinophils Absolute: 0.1 10*3/uL (ref 0.0–0.7)
Eosinophils Relative: 1.3 % (ref 0.0–5.0)
HCT: 41.7 % (ref 36.0–46.0)
Hemoglobin: 14.2 g/dL (ref 12.0–15.0)
Lymphocytes Relative: 29.3 % (ref 12.0–46.0)
Lymphs Abs: 2.8 10*3/uL (ref 0.7–4.0)
MCHC: 34.1 g/dL (ref 30.0–36.0)
MCV: 89.9 fl (ref 78.0–100.0)
Monocytes Absolute: 0.4 10*3/uL (ref 0.1–1.0)
Monocytes Relative: 4.4 % (ref 3.0–12.0)
Neutro Abs: 6.1 10*3/uL (ref 1.4–7.7)
Neutrophils Relative %: 64.4 % (ref 43.0–77.0)
Platelets: 335 10*3/uL (ref 150.0–400.0)
RBC: 4.64 Mil/uL (ref 3.87–5.11)
RDW: 13 % (ref 11.5–15.5)
WBC: 9.5 10*3/uL (ref 4.0–10.5)

## 2022-12-15 LAB — COMPREHENSIVE METABOLIC PANEL
ALT: 49 U/L — ABNORMAL HIGH (ref 0–35)
AST: 27 U/L (ref 0–37)
Albumin: 4.5 g/dL (ref 3.5–5.2)
Alkaline Phosphatase: 97 U/L (ref 39–117)
BUN: 11 mg/dL (ref 6–23)
CO2: 24 mEq/L (ref 19–32)
Calcium: 9.8 mg/dL (ref 8.4–10.5)
Chloride: 103 mEq/L (ref 96–112)
Creatinine, Ser: 0.95 mg/dL (ref 0.40–1.20)
GFR: 83.37 mL/min (ref >60.00)
Glucose, Bld: 159 mg/dL — ABNORMAL HIGH (ref 70–99)
Potassium: 3.6 mEq/L (ref 3.5–5.1)
Sodium: 139 mEq/L (ref 135–145)
Total Bilirubin: 0.4 mg/dL (ref 0.2–1.2)
Total Protein: 7.6 g/dL (ref 6.0–8.3)

## 2022-12-15 LAB — T4, FREE: Free T4: 0.73 ng/dL (ref 0.60–1.60)

## 2022-12-15 LAB — TSH: TSH: 2.67 u[IU]/mL (ref 0.35–5.50)

## 2022-12-15 NOTE — Patient Instructions (Addendum)
EKG normal other than slight tachycardia Updating labs today Referrals for Behavioral Health (mood and ADHD) and Pulmonology for possible sleep apnea workup.     Thank you for choosing [facility] at [facility] for your Primary Care needs. I am excited for the opportunity to partner with you to meet your health care goals. It was a pleasure meeting you today!  Information on diet, exercise, and health maintenance recommendations are listed below. This is information to help you be sure you are on track for optimal health and monitoring.   Please look over this and let us know if you have any questions or if you have completed any of the health maintenance outside of [facility] so that we can be sure your records are up to date.  ___________________________________________________________  MyChart:  For all urgent or time sensitive needs we ask that you please call the office to avoid delays. Our number is [phone]. MyChart is not constantly monitored and due to the large volume of messages a day, replies may take up to 72 business hours.  MyChart Policy: MyChart allows for you to see your visit notes, after visit summary, provider recommendations, lab and tests results, make an appointment, request refills, and contact your provider or the office for non-urgent questions or concerns. Providers are seeing patients during normal business hours and do not have built in time to review MyChart messages.  We ask that you allow a minimum of 3 business days for responses to [employer]. For this reason, please do not send urgent requests through MyChart. Please call the office at [phone]. New and ongoing conditions may require a visit. We have virtual and in-person visits available for your convenience.  Complex MyChart concerns may require a visit. Your provider may request you schedule a virtual or in-person visit to ensure we are providing the best care  possible. MyChart messages sent after 11:00 AM on Friday will not be received by the provider until Monday morning.    Lab and Test Results: You will receive your lab and test results on MyChart as soon as they are completed and results have been sent by the lab or testing facility. Due to this service, you will receive your results BEFORE your provider.  I review lab and test results each morning prior to seeing patients. Some results require collaboration with other providers to ensure you are receiving the most appropriate care. For this reason, we ask that you please allow a minimum of 3-5 business days from the time that ALL results have been received for your provider to receive and review lab and test results and contact you about these.  Most lab and test result comments from the provider will be sent through MyChart. Your provider may recommend changes to the plan of care, follow-up visits, repeat testing, ask questions, or request an office visit to discuss these results. You may reply directly to this message or call the office to provide information for the provider or set up an appointment. In some instances, you will be called with test results and recommendations. Please let us know if this is preferred and we will make note of this in your chart to provide this for you.    If you have not heard a response to your lab or test results in 5 business days from all results returning to MyChart, please call the office to let us know. We ask that you please avoid calling prior to this time  unless there is an emergent concern. Due to high call volumes, this can delay the resulting process.  After Hours: For all non-emergency after hours needs, please call the office at [phone] and select the option to reach the on-call  service. On-call services are shared between multiple [facility] offices and therefore it will not be possible to speak directly with your provider. On-call providers may  provide medical advice and recommendations, but are unable to provide refills for maintenance medications.  For all emergency or urgent medical needs after normal business hours, we recommend that you seek care at the closest Urgent Care or Emergency Department to ensure appropriate treatment in a timely manner.  [facility] has a 24 hour emergency room located on the ground floor for your convenience.   Urgent Concerns During the Business Day Providers are seeing patients from 8AM to [place] with a busy schedule and are most often not able to respond to non-urgent calls until the end of the day or the next business day. If you should have URGENT concerns during the day, please call and speak to the nurse or schedule a same day appointment so that we can address your concern without delay.   Thank you, again, for choosing me as your health care partner. I appreciate your trust and look forward to learning more about you!   Purcell Nails Olevia Bowens, DNP, FNP-C  ___________________________________________________________  Health Maintenance Recommendations Screening Testing Mammogram Every 1-2 years based on history and risk factors Starting at age 67 Pap Smear Ages 21-39 every 3 years Ages 45-65 every 5 years with HPV testing More frequent testing may be required based on results and history Colon Cancer Screening Every 1-10 years based on test performed, risk factors, and history Starting at age 75 Bone Density Screening Every 2-10 years based on history Starting at age 65 for women Recommendations for men differ based on medication usage, history, and risk factors AAA Screening One time ultrasound Men 59-22 years old who have ever smoked Lung Cancer Screening Low Dose Lung CT every 12 months Age 17-80 years with a 20 pack-year smoking history who still smoke or who have quit within the last 15 years  Screening Labs Routine  Labs: Complete Blood Count (CBC), Complete Metabolic Panel  (CMP), Cholesterol (Lipid Panel) Every 6-12 months based on history and medications May be recommended more frequently based on current conditions or previous results Hemoglobin A1c Lab Every 3-12 months based on history and previous results Starting at age 21 or earlier with diagnosis of diabetes, high cholesterol, BMI >26, and/or risk factors Frequent monitoring for patients with diabetes to ensure blood sugar control Thyroid Panel  Every 6 months based on history, symptoms, and risk factors May be repeated more often if on medication HIV One time testing for all patients 34 and older May be repeated more frequently for patients with increased risk factors or exposure Hepatitis C One time testing for all patients 63 and older May be repeated more frequently for patients with increased risk factors or exposure Gonorrhea, Chlamydia Every 12 months for all sexually active persons 13-24 years Additional monitoring may be recommended for those who are considered high risk or who have symptoms PSA Men 42-24 years old with risk factors Additional screening may be recommended from age 77-69 based on risk factors, symptoms, and history  Vaccine Recommendations Tetanus Booster All adults every 10 years Flu Vaccine All patients 6 months and older every year COVID Vaccine All patients 12 years and  older Initial dosing with booster May recommend additional booster based on age and health history HPV Vaccine 2 doses all patients age 54-26 Dosing may be considered for patients over 26 Shingles Vaccine (Shingrix) 2 doses all adults 13 years and older Pneumonia (Pneumovax 23) All adults 43 years and older May recommend earlier dosing based on health history Pneumonia (Prevnar 28) All adults 74 years and older Dosed 1 year after Pneumovax 23 Pneumonia (Prevnar 70) All adults 49 years and older (adults 93-23 with certain conditions or risk factors) 1 dose  For those who have not received  Prevnar 13 vaccine previously   Additional Screening, Testing, and Vaccinations may be recommended on an individualized basis based on family history, health history, risk factors, and/or exposure.  __________________________________________________________  Diet Recommendations for All Patients  I recommend that all patients maintain a diet low in saturated fats, carbohydrates, and cholesterol. While this can be challenging at first, it is not impossible and small changes can make big differences.  Things to try: Decreasing the amount of soda, sweet tea, and/or juice to one or less per day and replace with water While water is always the first choice, if you do not like water you may consider adding a water additive without sugar to improve the taste other sugar free drinks Replace potatoes with a brightly colored vegetable  Use healthy oils, such as canola oil or olive oil, instead of butter or hard margarine Limit your bread intake to two pieces or less a day Replace regular pasta with low carb pasta options Bake, broil, or grill foods instead of frying Monitor portion sizes  Eat smaller, more frequent meals throughout the day instead of large meals  An important thing to remember is, if you love foods that are not great for your health, you don't have to give them up completely. Instead, allow these foods to be a reward when you have done well. Allowing yourself to still have special treats every once in a while is a nice way to tell yourself thank you for working hard to keep yourself healthy.   Also remember that every day is a new day. If you have a bad day and "fall off the wagon", you can still climb right back up and keep moving along on your journey!  We have resources available to help you!  Some websites that may be helpful include: www.http://carter.biz/  Www.VeryWellFit.com _____________________________________________________________  Activity Recommendations for All  Patients  I recommend that all adults get at least 20 minutes of moderate physical activity that elevates your heart rate at least 5 days out of the week.  Some examples include: Walking or jogging at a pace that allows you to carry on a conversation Cycling (stationary bike or outdoors) Water aerobics Yoga Weight lifting Dancing If physical limitations prevent you from putting stress on your joints, exercise in a pool or seated in a chair are excellent options.  Do determine your MAXIMUM heart rate for activity: 220 - YOUR AGE = MAX Heart Rate   Remember! Do not push yourself too hard.  Start slowly and build up your pace, speed, weight, time in exercise, etc.  Allow your body to rest between exercise and get good sleep. You will need more water than normal when you are exerting yourself. Do not wait until you are thirsty to drink. Drink with a purpose of getting in at least 8, 8 ounce glasses of water a day plus more depending on how much you exercise and  sweat.    If you begin to develop dizziness, chest pain, abdominal pain, jaw pain, shortness of breath, headache, vision changes, lightheadedness, or other concerning symptoms, stop the activity and allow your body to rest. If your symptoms are severe, seek emergency evaluation immediately. If your symptoms are concerning, but not severe, please let us know so that we can recommend further evaluation.

## 2022-12-15 NOTE — Progress Notes (Signed)
New Patient Office Visit  Subjective    Patient ID: Patty Wilcox, female    DOB: 04-17-1996  Age: 27 y.o. MRN: [id number]  CC:  Chief Complaint  Patient presents with   Establish Care    HPI Patty Wilcox presents to establish care. She has not had primary care in many years.   Patient reports that about 9 years ago she lost her insurance and has not been able to have primary care since then. At the time she was taking Depakote, Cymbalta, and Adderall. She is unsure official diagnoses at that time. She has not been on daily meds since then. Over the past several years, she has utilized the ED multiple times for various complaints, which she reports they often report as anxiety related, but she is unsure.  States she has had various ongoing symptoms that she has not gotten diagnoses for: fatigue, heart "cramping" which describes as a daily squeezing sensation that comes and goes, tachycardia, memory problems and confusion, poor appetite/feeling full, generalized fatigue/weakness, bloating, irritability, worsening anxiety and depression, reflux, weight gain, and intermittent back pain.  She reports her GERD has been well controlled with omeprazole OTC and lifestyle measures.   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 She has not had any alcohol the past 3 days and denies any symptoms of withdrawal.    STOP-BANG for SLEEP APNEA Do you Snore loudly? Yes Do you often feel Tired during day? Yes Has anyone Observed you stop breathing? Yes, wake up gasping for air sometimes History of high blood Pressure? Yes BMI >35? Yes Age >50? No Neck circumference >16 in? No Gender female? No 5-8 = high risk 3-4 = intermediate 0-2 = low risk       Outpatient Encounter Medications as of 12/15/2022  Medication Sig   [DISCONTINUED] aspirin-acetaminophen-caffeine  (EXCEDRIN MIGRAINE) 250-250-65 MG tablet Take 2 tablets by mouth every 6 (six) hours as needed for headache.   [DISCONTINUED] oxyCODONE-acetaminophen (PERCOCET) 10-325 MG tablet Take 1 tablet by mouth every 6 (six) hours as needed for pain.   No facility-administered encounter medications on file as of 12/15/2022.    Past Medical History:  Diagnosis Date   ADHD    ADHD 12/15/2022   Anxiety    Anxiety    Depression    Obesity    Thyroid disease    Ureterolithiasis     Past Surgical History:  Procedure Laterality Date   NO PAST SURGERIES      Family History  Problem Relation Age of Onset   Mental illness Mother    Drug abuse Mother    Depression Father    Hypertension Father    Cancer Maternal Grandmother        throat    Social History   Socioeconomic History   Marital status: Single    Spouse name: Not on file   Number of children: Not on file   Years of education: Not on file   Highest education level: Not on file  Occupational History   Occupation: Ship broker    Comment: 8th grade at [employer]   Smoking status: Former    Packs/day: 0.15    Years: 10.00    Total pack years: 1.50    Types: Cigarettes, E-cigarettes   Smokeless tobacco: Former  Electronics engineer  Use   Vaping Use: Every day   Substances: Nicotine, Flavoring  Substance and Sexual Activity   Alcohol use: Yes    Alcohol/week: 1.0 standard drink of alcohol    Types: 1 Cans of beer per week   Drug use: Not Currently    Types: Marijuana    Comment: Twice per year   Sexual activity: Yes    Partners: Female  Other Topics Concern   Not on file  Social History Narrative   Not on file   Social Determinants of Health   Financial Resource Strain: Not on file  Food Insecurity: Not on file  Transportation Needs: Not on file  Physical Activity: Not on file  Stress: Not on file  Social Connections: Not on file  Intimate Partner Violence: Not on file    ROS All review of systems  negative except what is listed in the HPI      Objective    BP 121/84   Pulse (!) 118   Temp 98.7 F (37.1 C)   Resp 16   Ht 5' 3.5" (1.613 m)   Wt 240 lb (108.9 kg)   SpO2 100%   BMI 41.85 kg/m   Physical Exam Vitals reviewed.  Constitutional:      Appearance: Normal appearance. She is obese.  Cardiovascular:     Rate and Rhythm: Regular rhythm. Tachycardia present.     Pulses: Normal pulses.     Heart sounds: Normal heart sounds.  Pulmonary:     Effort: Pulmonary effort is normal.     Breath sounds: Normal breath sounds.  Musculoskeletal:     Cervical back: Normal range of motion and neck supple. No tenderness.  Lymphadenopathy:     Cervical: No cervical adenopathy.  Skin:    General: Skin is warm and dry.  Neurological:     Mental Status: She is alert and oriented to person, place, and time.  Psychiatric:        Mood and Affect: Mood normal.        Behavior: Behavior normal.        Thought Content: Thought content normal.        Judgment: Judgment normal.             Assessment & Plan:   Attention deficit hyperactivity disorder (ADHD), unspecified ADHD type Anxiety and depression Referral placed. No SI/HI. - Ambulatory referral to [facility]  Class 3 severe obesity without serious comorbidity with body mass index (BMI) of 40.0 to 44.9 in adult, unspecified obesity type (HCC) - TSH - T4, free - CBC with Differential/Platelet - Comprehensive metabolic panel     Chest discomfort Tachycardia Sleep apnea-like behavior Abnormal TSH EKG normal other than slight tachycardia Updating labs today Referral for sleep apnea like symptoms - EKG 12-Lead - Ambulatory referral to Pulmonology - TSH - T4, free    Return for pending labs .   Terrilyn Saver, NP

## 2022-12-16 ENCOUNTER — Other Ambulatory Visit: Payer: No Typology Code available for payment source

## 2022-12-16 ENCOUNTER — Other Ambulatory Visit: Payer: Self-pay

## 2022-12-16 ENCOUNTER — Encounter: Payer: Self-pay | Admitting: Family Medicine

## 2022-12-16 DIAGNOSIS — R0789 Other chest pain: Secondary | ICD-10-CM

## 2022-12-16 DIAGNOSIS — E1165 Type 2 diabetes mellitus with hyperglycemia: Secondary | ICD-10-CM

## 2022-12-16 LAB — HEMOGLOBIN A1C: Hgb A1c MFr Bld: 5.9 % (ref 4.6–6.5)

## 2022-12-30 ENCOUNTER — Encounter: Payer: Self-pay | Admitting: Cardiology

## 2022-12-30 NOTE — Progress Notes (Deleted)
Cardiology Office Note   Date:  12/31/2022   ID:  Patty Wilcox, DOB 03-Jan-1996, MRN [id number]  PCP:  Terrilyn Saver, NP  Cardiologist:   None Referring:  ***  No chief complaint on file.     History of Present Illness: Patty Wilcox is a 27 y.o. female who is referred by Terrilyn Saver, NP for evaluation of chest pain.  ***   Past Medical History:  Diagnosis Date   ADHD    Anxiety    Depression    Obesity    Thyroid disease    Ureterolithiasis     Past Surgical History:  Procedure Laterality Date   NO PAST SURGERIES       No current outpatient medications on file.   No current facility-administered medications for this visit.    Allergies:   Patient has no active allergies.    Social History:  The patient  reports that she has quit smoking. Her smoking use included cigarettes and e-cigarettes. She has a 1.50 pack-year smoking history. She has quit using smokeless tobacco. She reports current alcohol use of about 1.0 standard drink of alcohol per week. She reports that she does not currently use drugs after having used the following drugs: Marijuana.   Family History:  The patient's ***family history includes Cancer in her maternal grandmother; Depression in her father; Drug abuse in her mother; Hypertension in her father; Mental illness in her mother.    ROS:  Please see the history of present illness.   Otherwise, review of systems are positive for {NONE DEFAULTED:18576}.   All other systems are reviewed and negative.    PHYSICAL EXAM: VS:  There were no vitals taken for this visit. , BMI There is no height or weight on file to calculate BMI. GENERAL:  Well appearing HEENT:  Pupils equal round and reactive, fundi not visualized, oral mucosa unremarkable NECK:  No jugular venous distention, waveform within normal limits, carotid upstroke brisk and symmetric, no bruits, no thyromegaly LYMPHATICS:  No cervical, inguinal adenopathy LUNGS:  Clear to auscultation  bilaterally BACK:  No CVA tenderness CHEST:  Unremarkable HEART:  PMI not displaced or sustained,S1 and S2 within normal limits, no S3, no S4, no clicks, no rubs, *** murmurs ABD:  Flat, positive bowel sounds normal in frequency in pitch, no bruits, no rebound, no guarding, no midline pulsatile mass, no hepatomegaly, no splenomegaly EXT:  2 plus pulses throughout, no edema, no cyanosis no clubbing SKIN:  No rashes no nodules NEURO:  Cranial nerves II through XII grossly intact, motor grossly intact throughout PSYCH:  Cognitively intact, oriented to person place and time    EKG:  EKG {ACTION; IS/IS KGU:[id number]} ordered today. The ekg ordered today demonstrates ***   Recent Labs: 12/15/2022: ALT 49; BUN 11; Creatinine, Ser 0.95; Hemoglobin 14.2; Platelets 335.0; Potassium 3.6; Sodium 139; TSH 2.67    Lipid Panel    Component Value Date/Time   CHOL 137 12/13/2011 0720   TRIG 90 12/13/2011 0720   HDL 37 12/13/2011 0720   CHOLHDL 3.7 12/13/2011 0720   VLDL 18 12/13/2011 0720   LDLCALC 82 12/13/2011 0720      Wt Readings from Last 3 Encounters:  12/15/22 240 lb (108.9 kg)  11/19/22 225 lb (102.1 kg)  10/16/22 228 lb (103.4 kg)      Other studies Reviewed: Additional studies/ records that were reviewed today include: ***. Review of the above records demonstrates:  Please see elsewhere in the  note.  ***   ASSESSMENT AND PLAN:  CHEST PAIN:  ***   Current medicines are reviewed at length with the patient today.  The patient {ACTIONS; HAS/DOES NOT HAVE:19233} concerns regarding medicines.  The following changes have been made:  {PLAN; NO CHANGE:13088:s}  Labs/ tests ordered today include: *** No orders of the defined types were placed in this encounter.    Disposition:   FU with ***    Signed, Minus Breeding, MD  12/31/2022 8:15 AM    [place]

## 2022-12-31 ENCOUNTER — Ambulatory Visit: Payer: No Typology Code available for payment source | Attending: Cardiology | Admitting: Cardiology

## 2022-12-31 DIAGNOSIS — R072 Precordial pain: Secondary | ICD-10-CM

## 2023-01-01 ENCOUNTER — Institutional Professional Consult (permissible substitution): Payer: No Typology Code available for payment source | Admitting: Adult Health

## 2023-02-14 NOTE — Progress Notes (Deleted)
   Established Patient Office Visit  Subjective   Patient ID: RONIYA GRABENSTEIN, female    DOB: 1996/11/09  Age: 27 y.o. MRN: [id number]  No chief complaint on file.   HPI   Patient is here for routine follow-up. She was last seen here on 12/15/22 to establish care.   At last visit, she requested referral to [facility] for anxiety, depression, ADHD. At the time she was also reporting various associated symptoms and was referred to cardiology and pulmonology - both of which she had no-show appointments in February.    ***   {History (Optional):23778}  ROS    Objective:     There were no vitals taken for this visit. {Vitals History (Optional):23777}  Physical Exam   No results found for any visits on 02/15/23.  {Labs (Optional):23779}  The ASCVD Risk score (Arnett DK, et al., 2019) failed to calculate for the following reasons:   The 2019 ASCVD risk score is only valid for ages 76 to 28    Assessment & Plan:   Problem List Items Addressed This Visit   None   No follow-ups on file.    Terrilyn Saver, NP

## 2023-02-15 ENCOUNTER — Ambulatory Visit: Payer: No Typology Code available for payment source | Admitting: Family Medicine
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
## 2023-03-05 ENCOUNTER — Ambulatory Visit (INDEPENDENT_AMBULATORY_CARE_PROVIDER_SITE_OTHER): Payer: No Typology Code available for payment source | Admitting: Psychiatry

## 2023-03-05 ENCOUNTER — Encounter (HOSPITAL_COMMUNITY): Payer: Self-pay | Admitting: Psychiatry

## 2023-03-05 DIAGNOSIS — F17290 Nicotine dependence, other tobacco product, uncomplicated: Secondary | ICD-10-CM

## 2023-03-05 DIAGNOSIS — F419 Anxiety disorder, unspecified: Secondary | ICD-10-CM

## 2023-03-05 DIAGNOSIS — F411 Generalized anxiety disorder: Secondary | ICD-10-CM

## 2023-03-05 DIAGNOSIS — F1090 Alcohol use, unspecified, uncomplicated: Secondary | ICD-10-CM

## 2023-03-05 DIAGNOSIS — F332 Major depressive disorder, recurrent severe without psychotic features: Secondary | ICD-10-CM | POA: Diagnosis not present

## 2023-03-05 DIAGNOSIS — Z720 Tobacco use: Secondary | ICD-10-CM

## 2023-03-05 MED ORDER — BUPROPION HCL ER (XL) 150 MG PO TB24: 150.0000 mg | ORAL_TABLET | ORAL | 2 refills | Status: DC

## 2023-03-05 MED ORDER — NICOTINE 14 MG/24HR TD PT24: 14.0000 mg | MEDICATED_PATCH | Freq: Every day | TRANSDERMAL | 5 refills | Status: AC

## 2023-03-05 NOTE — Patient Instructions (Signed)
Thank you for attending your appointment today.  -- START Wellbutrin XL 150 mg daily -- START nicotine patch 14 mg daily; remove before bedtime -- Continue other medications as prescribed.  Please do not make any changes to medications without first discussing with your provider. If you are experiencing a psychiatric emergency, please call 911 or present to your nearest emergency department. Additional crisis, medication management, and therapy resources are included below.  [facility]  [address] [place], [place] [address] [phone] WALK-IN URGENT CARE 24/7 FOR ANYONE [address], [place], [place]  [phone] Fax: [phone] [email or web address] *Interpreters available *Accepts all insurance and uninsured for Urgent Care needs *Accepts Medicaid and uninsured for outpatient treatment (below)      ONLY FOR [facility]  Below:    Outpatient New Patient Assessment/Therapy Walk-ins:        Monday -Thursday 8am until slots are full.        Every Friday 1pm-4pm  (first come, first served)                   New Patient Psychiatry/Medication Management        Monday-Friday 8am-11am (first come, first served)               For all walk-ins we ask that you arrive by 7:15am, because patients will be seen in the order of arrival.

## 2023-03-10 DIAGNOSIS — M25561 Pain in right knee: Secondary | ICD-10-CM | POA: Diagnosis not present
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
## 2023-04-06 ENCOUNTER — Encounter (HOSPITAL_COMMUNITY): Payer: Self-pay | Admitting: Psychiatry

## 2023-04-06 ENCOUNTER — Ambulatory Visit (INDEPENDENT_AMBULATORY_CARE_PROVIDER_SITE_OTHER): Payer: No Typology Code available for payment source | Admitting: Psychiatry

## 2023-04-06 ENCOUNTER — Telehealth: Payer: No Typology Code available for payment source | Admitting: Family Medicine

## 2023-04-06 DIAGNOSIS — F331 Major depressive disorder, recurrent, moderate: Secondary | ICD-10-CM

## 2023-04-06 DIAGNOSIS — F411 Generalized anxiety disorder: Secondary | ICD-10-CM

## 2023-04-06 DIAGNOSIS — R030 Elevated blood-pressure reading, without diagnosis of hypertension: Secondary | ICD-10-CM

## 2023-04-06 MED ORDER — BUPROPION HCL ER (XL) 300 MG PO TB24: 300.0000 mg | ORAL_TABLET | ORAL | 2 refills | Status: DC

## 2023-04-06 NOTE — Patient Instructions (Signed)
Thank you for attending your appointment today.  -- INCREASE Wellbutrin XL to 300 mg daily -- Continue other medications as prescribed.  Please do not make any changes to medications without first discussing with your provider. If you are experiencing a psychiatric emergency, please call 911 or present to your nearest emergency department. Additional crisis, medication management, and therapy resources are included below.  [facility]  [address], [place], [place] [address] [phone] WALK-IN URGENT CARE 24/7 FOR ANYONE [address], [place], [place]  [phone] Fax: [phone] [email or web address] *Interpreters available *Accepts all insurance and uninsured for Urgent Care needs *Accepts Medicaid and uninsured for outpatient treatment (below)      ONLY FOR [place] Residents  Below:    Outpatient New Patient Assessment/Therapy Walk-ins:        Monday -Thursday 8am until slots are full.        Every Friday 1pm-4pm  (first come, first served)                   New Patient Psychiatry/Medication Management        Monday-Friday 8am-11am (first come, first served)               For all walk-ins we ask that you arrive by 7:15am, because patients will be seen in the order of arrival.   

## 2023-04-06 NOTE — Progress Notes (Signed)
   Would like to get started on BP medication has elevated BP 130/90 range several times over the last few visits with providers.  Advised she will need to see her PCP for this.  Patient acknowledged agreement and understanding of the plan.

## 2023-04-08 ENCOUNTER — Ambulatory Visit: Payer: No Typology Code available for payment source | Admitting: Family Medicine

## 2023-04-12 ENCOUNTER — Ambulatory Visit (INDEPENDENT_AMBULATORY_CARE_PROVIDER_SITE_OTHER): Payer: Self-pay | Admitting: Family Medicine

## 2023-04-12 DIAGNOSIS — Z91199 Patient's noncompliance with other medical treatment and regimen due to unspecified reason: Secondary | ICD-10-CM

## 2023-04-12 NOTE — Progress Notes (Signed)
No show

## 2023-04-13 ENCOUNTER — Telehealth: Payer: Self-pay | Admitting: Neurology

## 2023-04-13 ENCOUNTER — Encounter: Payer: Self-pay | Admitting: Family Medicine

## 2023-04-13 NOTE — Telephone Encounter (Signed)
Patient had 3rd no show yesterday. She has already been mailed a no show letter after her second no show. Thanks.

## 2023-04-15 ENCOUNTER — Telehealth: Payer: Self-pay | Admitting: Family Medicine

## 2023-04-15 NOTE — Telephone Encounter (Signed)
error 

## 2023-04-18 ENCOUNTER — Encounter: Payer: Self-pay | Admitting: Family Medicine

## 2023-04-20 ENCOUNTER — Encounter: Payer: Self-pay | Admitting: Cardiology

## 2023-04-20 ENCOUNTER — Ambulatory Visit: Payer: Medicaid Other | Attending: Cardiology | Admitting: Cardiology

## 2023-04-20 VITALS — BP 134/78 | HR 97 | Ht 63.5 in | Wt 236.0 lb

## 2023-04-20 DIAGNOSIS — R0609 Other forms of dyspnea: Secondary | ICD-10-CM | POA: Diagnosis not present

## 2023-04-20 DIAGNOSIS — R0683 Snoring: Secondary | ICD-10-CM

## 2023-04-20 DIAGNOSIS — E785 Hyperlipidemia, unspecified: Secondary | ICD-10-CM

## 2023-04-20 DIAGNOSIS — F332 Major depressive disorder, recurrent severe without psychotic features: Secondary | ICD-10-CM

## 2023-04-20 DIAGNOSIS — R0789 Other chest pain: Secondary | ICD-10-CM | POA: Diagnosis not present

## 2023-04-20 DIAGNOSIS — Z720 Tobacco use: Secondary | ICD-10-CM

## 2023-04-20 NOTE — Patient Instructions (Addendum)
Medication Instructions:  Your physician recommends that you continue on your current medications as directed. Please refer to the Current Medication list given to you today.  *If you need a refill on your cardiac medications before your next appointment, please call your pharmacy*   Lab Work: Your physician recommends that you return for lab work in: when fasting You need to have labs done when you are fasting.  You can come Monday through Friday 8:00 am to 11:30 am and 1:00 to 4:00. You do not need to make an appointment as the order has already been placed.   Testing/Procedures: Your physician has requested that you have an echocardiogram. Echocardiography is a painless test that uses sound waves to create images of your heart. It provides your doctor with information about the size and shape of your heart and how well your heart's chambers and valves are working. This procedure takes approximately one hour. There are no restrictions for this procedure. Please do NOT wear cologne, perfume, aftershave, or lotions (deodorant is allowed). Please arrive 15 minutes prior to your appointment time.    Follow-Up: At [facility], you and your health needs are our priority.  As part of our continuing mission to provide you with exceptional heart care, we have created designated Provider Care Teams.  These Care Teams include your primary Cardiologist (physician) and Advanced Practice Providers (APPs -  Physician Assistants and Nurse Practitioners) who all work together to provide you with the care you need, when you need it.  We recommend signing up for the patient portal called "MyChart".  Sign up information is provided on this After Visit Summary.  MyChart is used to connect with patients for Virtual Visits (Telemedicine).  Patients are able to view lab/test results, encounter notes, upcoming appointments, etc.  Non-urgent messages can be sent to your provider as well.   To learn more about what  you can do with MyChart, go to [email or web address].    Your next appointment:   3 month(s)  The format for your next appointment:   In Person  Provider:   Gypsy Balsam, MD    Other Instructions [facility] Sleep Study - will call when  authorized by insurance

## 2023-04-20 NOTE — Progress Notes (Signed)
Cardiology Consultation:    Date:  04/20/2023   ID:  Patty Wilcox, DOB 1996-05-22, MRN [id number]  PCP:  Clayborne Dana, NP  Cardiologist:  Gypsy Balsam, MD   Referring MD: Clayborne Dana, NP   Chief Complaint  Patient presents with   Chest Pain   elevated HR   Hypertension    History of Present Illness:    Patty Wilcox is a 27 y.o. female who is being seen today for the evaluation of hypertension, atypical chest pain at the request of Clayborne Dana, NP.  Past medical history significant for anxiety depression, she used to drink a lot of alcohol but 2 months ago she stopped doing it.  She drinks maybe 2 beers a day no more than that.  She still continues to smoke.  She has been struggling with chest pain for about 2 years.  Pain is located on the left side of her chest usually last for almost a day if not longer.  There is no provoking or relieving factor.  She have difficulty grading this sensation but she said it is significant enough for me to understand that is a problem.  Denies having any sweating or shortness of breath associated with this sensation.  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  She reported the fact that she snores I suspect she may have sleep apnea.  Denies having any palpitation or passing out.  Does have some swelling of lower extremities at evening time  Past Medical History:  Diagnosis Date   ADHD    Anxiety    Depression    Obesity    Thyroid disease    Ureterolithiasis     Past Surgical History:  Procedure Laterality Date   NO PAST SURGERIES      Current Medications: Current Meds  Medication Sig   albuterol (VENTOLIN HFA) 108 (90 Base) MCG/ACT inhaler Inhale 2 puffs into the lungs every 6 (six) hours as needed for  wheezing or shortness of breath.   buPROPion (WELLBUTRIN XL) 300 MG 24 hr tablet Take 1 tablet (300 mg total) by mouth every morning.   doxylamine, Sleep, (UNISOM) 25 MG tablet Take 25 mg by mouth at bedtime as needed for sleep.   ibuprofen (ADVIL) 400 MG tablet Take 400 mg by mouth every 6 (six) hours as needed for headache or mild pain.   meloxicam (MOBIC) 15 MG tablet Take 15 mg by mouth daily.   nicotine (NICODERM CQ - DOSED IN MG/24 HOURS) 14 mg/24hr patch Place 1 patch (14 mg total) onto the skin daily. Remove before bedtime.     Allergies:   Patient has no known allergies.   Social History   Socioeconomic History   Marital status: Significant Other    Spouse name: Not on file   Number of children: Not on file   Years of education: Not on file   Highest education level: Not on file  Occupational History   Occupation: Student    Comment: 8th grade at [employer]  Tobacco Use   Smoking status: Former    Packs/day: 0.15    Years: 10.00    Additional pack years: 0.00    Total pack years: 1.50  Types: Cigarettes, E-cigarettes   Smokeless tobacco: Former  Building services engineer Use: Every day   Substances: Nicotine, Flavoring  Substance and Sexual Activity   Alcohol use: Yes    Alcohol/week: 1.0 standard drink of alcohol    Types: 1 Cans of beer per week    Comment: 2 beers on the weekend; history of excessive use in the past   Drug use: Not Currently    Types: Marijuana    Comment: Twice per year   Sexual activity: Yes    Partners: Female  Other Topics Concern   Not on file  Social History Narrative   Not on file   Social Determinants of Health   Financial Resource Strain: Not on file  Food Insecurity: Not on file  Transportation Needs: Not on file  Physical Activity: Not on file  Stress: Not on file  Social Connections: Not on file     Family History: The patient's family history includes Cancer in her maternal grandmother; Depression in her father;  Drug abuse in her mother; Hypertension in her father; Mental illness in her mother. ROS:   Please see the history of present illness.    All 14 point review of systems negative except as described per history of present illness.  EKGs/Labs/Other Studies Reviewed:    The following studies were reviewed today:   EKG:  EKG is  ordered today.  The ekg ordered today demonstrates normal sinus rhythm, normal P interval, normal QS complex duration fulgent no ST segment changes  Recent Labs: 12/15/2022: ALT 49; BUN 11; Creatinine, Ser 0.95; Hemoglobin 14.2; Platelets 335.0; Potassium 3.6; Sodium 139; TSH 2.67  Recent Lipid Panel    Component Value Date/Time   CHOL 137 12/13/2011 0720   TRIG 90 12/13/2011 0720   HDL 37 12/13/2011 0720   CHOLHDL 3.7 12/13/2011 0720   VLDL 18 12/13/2011 0720   LDLCALC 82 12/13/2011 0720    Physical Exam:    VS:  BP 134/78 (BP Location: Left Arm, Patient Position: Sitting)   Pulse 97   Ht 5' 3.5" (1.613 m)   Wt 236 lb (107 kg)   SpO2 97%   BMI 41.15 kg/m     Wt Readings from Last 3 Encounters:  04/20/23 236 lb (107 kg)  12/15/22 240 lb (108.9 kg)  11/19/22 225 lb (102.1 kg)     GEN:  Well nourished, well developed in no acute distress HEENT: Normal NECK: No JVD; No carotid bruits LYMPHATICS: No lymphadenopathy CARDIAC: RRR, no murmurs, no rubs, no gallops RESPIRATORY:  Clear to auscultation without rales, wheezing or rhonchi  ABDOMEN: Soft, non-tender, non-distended MUSCULOSKELETAL:  No edema; No deformity  SKIN: Warm and dry NEUROLOGIC:  Alert and oriented x 3 PSYCHIATRIC:  Normal affect   ASSESSMENT:    1. Dyspnea on exertion   2. Snoring   3. Dyslipidemia   4. Atypical chest pain   5. Tobacco abuse   6. Severe episode of recurrent major depressive disorder, without psychotic features (HCC)    PLAN:    In order of problems listed above:  Dyspnea on exertion: Will schedule her to have echocardiogram to assess left ventricle  ejection fraction. Snoring I suspect sleep apnea being a significant problem.  Sleep study will be scheduled. Dyslipidemia, I do not have fasting lipid profile will schedule her to have the test done. Smoking obviously a problem we spent some time talking about this I strongly recommend to quit hopefully should be able to accomplish that  goal. Anxiety and depression probably contributing to his symptomatology.  However we can still rule out organic problem before committing all his symptomatology to her anxiety. Chest pain very atypical does not look like cardiac, she does have however risk factors I will check her cholesterol based on that we decide if we need to do any ischemia workup   Medication Adjustments/Labs and Tests Ordered: Current medicines are reviewed at length with the patient today.  Concerns regarding medicines are outlined above.  Orders Placed This Encounter  Procedures   ALT   AST   Lipid panel   EKG 12-Lead   ECHOCARDIOGRAM COMPLETE   [facility] Sleep Study   No orders of the defined types were placed in this encounter.   Signed, Georgeanna Lea, MD, [facility]. 04/20/2023 5:49 PM    [facility]

## 2023-04-21 ENCOUNTER — Telehealth: Payer: Self-pay

## 2023-04-21 NOTE — Telephone Encounter (Signed)
**Note De-Identified Patty Wilcox Obfuscation** 5/29-READY This [employer] Medicaid members plan does not currently require a prior authorization for these services. Decision ID #: [id number]

## 2023-04-21 NOTE — Telephone Encounter (Signed)
**Note De-identified Yelitza Reach Obfuscation** -----  **Note De-Identified Lafayette Dunlevy Obfuscation** Message from Neena Rhymes, RN sent at 04/20/2023  4:58 PM EDT ----- Pt needs auth for Itamar sleep study. TY Dede

## 2023-04-27 ENCOUNTER — Telehealth (HOSPITAL_COMMUNITY): Payer: Self-pay | Admitting: *Deleted

## 2023-04-27 NOTE — Telephone Encounter (Signed)
Wellbutrin was causing headaches, irritability and not helping with depression or focusing. She stopped taking it 1 week ago and needs to discuss medications.

## 2023-05-12 ENCOUNTER — Ambulatory Visit (HOSPITAL_BASED_OUTPATIENT_CLINIC_OR_DEPARTMENT_OTHER): Payer: Medicaid Other | Attending: Cardiology
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
## 2023-05-18 ENCOUNTER — Encounter (HOSPITAL_COMMUNITY): Payer: No Typology Code available for payment source | Admitting: Psychiatry

## 2023-05-20 NOTE — Progress Notes (Signed)
Patient did not connect for virtual psychiatric medication management appointment on 05/21/23 at 10AM. Sent secure video link with no response. Left VM with callback number to reschedule.  Daine Gip, MD 05/21/23

## 2023-05-21 ENCOUNTER — Encounter (HOSPITAL_COMMUNITY): Payer: No Typology Code available for payment source | Admitting: Psychiatry

## 2023-05-21 ENCOUNTER — Encounter (HOSPITAL_COMMUNITY): Payer: Self-pay

## 2023-05-31 ENCOUNTER — Telehealth (HOSPITAL_COMMUNITY): Payer: Self-pay | Admitting: Psychiatry

## 2023-06-15 ENCOUNTER — Ambulatory Visit (HOSPITAL_BASED_OUTPATIENT_CLINIC_OR_DEPARTMENT_OTHER): Payer: Medicaid Other | Attending: Cardiology

## 2023-06-15 ENCOUNTER — Other Ambulatory Visit: Payer: Self-pay

## 2023-06-15 ENCOUNTER — Encounter (HOSPITAL_BASED_OUTPATIENT_CLINIC_OR_DEPARTMENT_OTHER): Payer: Self-pay | Admitting: Emergency Medicine

## 2023-06-15 DIAGNOSIS — R300 Dysuria: Secondary | ICD-10-CM | POA: Insufficient documentation

## 2023-06-15 DIAGNOSIS — R350 Frequency of micturition: Secondary | ICD-10-CM | POA: Diagnosis not present

## 2023-06-15 DIAGNOSIS — R109 Unspecified abdominal pain: Secondary | ICD-10-CM | POA: Insufficient documentation

## 2023-06-15 DIAGNOSIS — M545 Low back pain, unspecified: Secondary | ICD-10-CM | POA: Insufficient documentation

## 2023-06-15 LAB — PREGNANCY, URINE: Preg Test, Ur: NEGATIVE

## 2023-06-15 LAB — BASIC METABOLIC PANEL
Anion gap: 11 (ref 5–15)
BUN: 16 mg/dL (ref 6–20)
CO2: 22 mmol/L (ref 22–32)
Calcium: 9.2 mg/dL (ref 8.9–10.3)
Chloride: 103 mmol/L (ref 98–111)
Creatinine, Ser: 0.88 mg/dL (ref 0.44–1.00)
GFR, Estimated: >60 mL/min (ref >60)
Glucose, Bld: 145 mg/dL — ABNORMAL HIGH (ref 70–99)
Potassium: 3.7 mmol/L (ref 3.5–5.1)
Sodium: 136 mmol/L (ref 135–145)

## 2023-06-15 LAB — URINALYSIS, ROUTINE W REFLEX MICROSCOPIC
Bilirubin Urine: NEGATIVE
Glucose, UA: NEGATIVE mg/dL
Ketones, ur: NEGATIVE mg/dL
Leukocytes,Ua: NEGATIVE
Nitrite: NEGATIVE
Protein, ur: NEGATIVE mg/dL
Specific Gravity, Urine: 1.025 (ref 1.005–1.030)
pH: 7 (ref 5.0–8.0)

## 2023-06-15 LAB — CBC
HCT: 42.4 % (ref 36.0–46.0)
Hemoglobin: 13.9 g/dL (ref 12.0–15.0)
MCH: 28.5 pg (ref 26.0–34.0)
MCHC: 32.8 g/dL (ref 30.0–36.0)
MCV: 86.9 fL (ref 80.0–100.0)
Platelets: 364 10*3/uL (ref 150–400)
RBC: 4.88 MIL/uL (ref 3.87–5.11)
RDW: 13.2 % (ref 11.5–15.5)
WBC: 12.3 10*3/uL — ABNORMAL HIGH (ref 4.0–10.5)
nRBC: 0 % (ref 0.0–0.2)

## 2023-06-15 LAB — URINALYSIS, MICROSCOPIC (REFLEX): Bacteria, UA: NONE SEEN
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
## 2023-06-16 ENCOUNTER — Emergency Department (HOSPITAL_BASED_OUTPATIENT_CLINIC_OR_DEPARTMENT_OTHER)
Admission: EM | Admit: 2023-06-16 | Discharge: 2023-06-16 | Disposition: A | Discharge status: Home / Self Care | Payer: No Typology Code available for payment source | Attending: Emergency Medicine | Admitting: Emergency Medicine

## 2023-06-16 ENCOUNTER — Emergency Department (HOSPITAL_BASED_OUTPATIENT_CLINIC_OR_DEPARTMENT_OTHER): Payer: No Typology Code available for payment source

## 2023-06-16 DIAGNOSIS — M545 Low back pain, unspecified: Secondary | ICD-10-CM | POA: Diagnosis not present

## 2023-06-16 DIAGNOSIS — R109 Unspecified abdominal pain: Secondary | ICD-10-CM

## 2023-06-16 LAB — HEPATIC FUNCTION PANEL
ALT: 35 U/L (ref 0–44)
AST: 23 U/L (ref 15–41)
Albumin: 4.1 g/dL (ref 3.5–5.0)
Alkaline Phosphatase: 93 U/L (ref 38–126)
Bilirubin, Direct: <0.1 mg/dL (ref 0.0–0.2)
Total Bilirubin: <0.1 mg/dL — ABNORMAL LOW (ref 0.3–1.2)
Total Protein: 7.5 g/dL (ref 6.5–8.1)

## 2023-06-16 LAB — LIPASE, BLOOD: Lipase: 52 U/L — ABNORMAL HIGH (ref 11–51)

## 2023-06-16 MED ORDER — IBUPROFEN 400 MG PO TABS
600.0000 mg | ORAL_TABLET | Freq: Once | ORAL | Status: AC
  Administered 2023-06-16: 600 mg via ORAL
  Filled 2023-06-16: qty 1

## 2023-06-16 NOTE — Discharge Instructions (Signed)
Drink plenty of fluids and get plenty of rest.  Take ibuprofen 600 mg every 6 hours as needed for pain.  Follow-up with your primary doctor if symptoms are not improving in the next week.
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
## 2023-06-16 NOTE — ED Notes (Signed)
Patient transported to CT 

## 2023-06-21 ENCOUNTER — Other Ambulatory Visit: Payer: Self-pay

## 2023-06-21 ENCOUNTER — Emergency Department (HOSPITAL_COMMUNITY)
Admission: EM | Admit: 2023-06-21 | Discharge: 2023-06-21 | Disposition: A | Discharge status: Home / Self Care | Payer: No Typology Code available for payment source | Attending: Emergency Medicine | Admitting: Emergency Medicine

## 2023-06-21 ENCOUNTER — Encounter (HOSPITAL_COMMUNITY): Payer: Self-pay

## 2023-06-21 DIAGNOSIS — G8929 Other chronic pain: Secondary | ICD-10-CM | POA: Insufficient documentation

## 2023-06-21 DIAGNOSIS — M545 Low back pain, unspecified: Secondary | ICD-10-CM | POA: Insufficient documentation

## 2023-06-21 DIAGNOSIS — M5459 Other low back pain: Secondary | ICD-10-CM | POA: Diagnosis not present

## 2023-06-21 MED ORDER — PREDNISONE 10 MG (21) PO TBPK: ORAL_TABLET | Freq: Every day | ORAL | 0 refills | Status: DC

## 2023-06-21 NOTE — ED Provider Notes (Signed)
[place] EMERGENCY DEPARTMENT AT [facility] Provider Note   CSN: [id number] Arrival date & time: 06/21/23  1147     History  Chief Complaint  Patient presents with   Back Pain    Patty Wilcox is a 27 y.o. female.  27 year old female presents with chronic low back pain.  Has been seen by orthopedics for the same thing.  Patient is scheduled see pain management in the near future.  Has been using muscle actions limited relief.  No bowel or bladder dysfunction.  No recent history of trauma.  No fever or chills.  Pain is worse with movement.  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  Was told that she would need to have a CT scan of her lumbar spine in the future to better look at a known fusion that is occurring between L5 and S1.       Home Medications Prior to Admission medications   Medication Sig Start Date End Date Taking? Authorizing Provider  albuterol (VENTOLIN HFA) 108 (90 Base) MCG/ACT inhaler Inhale 2 puffs into the lungs every 6 (six) hours as needed for wheezing or shortness of breath. 01/12/23 01/12/24  [provider]  buPROPion (WELLBUTRIN XL) 300 MG 24 hr tablet Take 1 tablet (300 mg total) by mouth every morning. 04/06/23 07/05/23  Bahraini, Sarah A  doxylamine, Sleep, (UNISOM) 25 MG tablet Take 25 mg by mouth at bedtime as needed for sleep.    [provider]  ibuprofen (ADVIL) 400 MG tablet Take 400 mg by mouth every 6 (six) hours as needed for headache or mild pain.    [provider]  meloxicam (MOBIC) 15 MG tablet Take 15 mg by mouth daily. 03/10/23   [provider]  nicotine (NICODERM CQ - DOSED IN MG/24 HOURS) 14 mg/24hr patch Place 1 patch (14 mg total) onto the skin daily. Remove before bedtime. 03/05/23   Bahraini, Sarah A      Allergies    Patient has no known allergies.    Review of Systems   Review of Systems  All other systems reviewed and are negative.   Physical Exam Updated Vital Signs BP  132/87 (BP Location: Right Arm)   Pulse 73   Temp 97.7 F (36.5 C) (Oral)   Resp 16   Ht 1.6 m (5\' 3" )   Wt 102.1 kg   SpO2 100%   BMI 39.86 kg/m  Physical Exam Vitals and nursing note reviewed.  Constitutional:      General: She is not in acute distress.    Appearance: Normal appearance. She is well-developed. She is not toxic-appearing.  HENT:     Head: Normocephalic and atraumatic.  Eyes:     General: Lids are normal.     Conjunctiva/sclera: Conjunctivae normal.     Pupils: Pupils are equal, round, and reactive to light.  Neck:     Thyroid: No thyroid mass.     Trachea: No tracheal deviation.  Cardiovascular:     Rate and Rhythm: Normal rate and regular rhythm.     Heart sounds: Normal heart sounds. No murmur heard.    No gallop.  Pulmonary:     Effort: Pulmonary effort is normal. No respiratory distress.     Breath sounds: Normal breath sounds. No stridor. No decreased breath sounds, wheezing, rhonchi or rales.  Abdominal:     General: There is no distension.     Palpations: Abdomen is soft.     Tenderness:  There is no abdominal tenderness. There is no rebound.  Musculoskeletal:        General: No tenderness. Normal range of motion.     Cervical back: Normal range of motion and neck supple.       Legs:  Skin:    General: Skin is warm and dry.     Findings: No abrasion or rash.  Neurological:     General: No focal deficit present.     Mental Status: She is alert and oriented to person, place, and time. Mental status is at baseline.     GCS: GCS eye subscore is 4. GCS verbal subscore is 5. GCS motor subscore is 6.     Cranial Nerves: Cranial nerves are intact. No cranial nerve deficit.     Sensory: No sensory deficit.     Motor: Motor function is intact.  Psychiatric:        Attention and Perception: Attention normal.        Speech: Speech normal.        Behavior: Behavior normal.     ED Results / Procedures / Treatments   Labs (all labs ordered are  listed, but only abnormal results are displayed) Labs Reviewed - No data to display  EKG None  Radiology No results found.  Procedures Procedures    Medications Ordered in ED Medications - No data to display  ED Course/ Medical Decision Making/ A&P                             Medical Decision Making  No concern for cauda equina at this time.  Neurological exam is stable.  Will prescribe prednisone and patient to follow-up with her doctor as needed        Final Clinical Impression(s) / ED Diagnoses Final diagnoses:  None    Rx / DC Orders ED Discharge Orders     None         Lorre Nick, MD 06/21/23 1553

## 2023-06-21 NOTE — ED Triage Notes (Signed)
Reports has been to emergortho for her back pain hx of DDD and the reports she wants to follow up with nodules on her lungs.  Patient is requesting a CT.

## 2023-07-05 DIAGNOSIS — M79605 Pain in left leg: Secondary | ICD-10-CM | POA: Diagnosis not present

## 2023-07-05 DIAGNOSIS — Z79899 Other long term (current) drug therapy: Secondary | ICD-10-CM | POA: Diagnosis not present

## 2023-07-05 DIAGNOSIS — Z6839 Body mass index (BMI) 39.0-39.9, adult: Secondary | ICD-10-CM | POA: Diagnosis not present

## 2023-07-05 DIAGNOSIS — M545 Low back pain, unspecified: Secondary | ICD-10-CM | POA: Diagnosis not present

## 2023-07-05 DIAGNOSIS — R030 Elevated blood-pressure reading, without diagnosis of hypertension: Secondary | ICD-10-CM | POA: Diagnosis not present

## 2023-07-27 ENCOUNTER — Ambulatory Visit: Payer: Medicaid Other | Attending: Cardiology | Admitting: Cardiology

## 2023-08-21 ENCOUNTER — Encounter (HOSPITAL_COMMUNITY): Payer: Self-pay

## 2023-08-21 ENCOUNTER — Other Ambulatory Visit: Payer: Self-pay

## 2023-08-21 ENCOUNTER — Emergency Department (HOSPITAL_COMMUNITY): Payer: Medicaid Other

## 2023-08-21 ENCOUNTER — Emergency Department (HOSPITAL_COMMUNITY)
Admission: EM | Admit: 2023-08-21 | Discharge: 2023-08-22 | Disposition: A | Discharge status: Home / Self Care | Payer: Medicaid Other | Attending: Emergency Medicine | Admitting: Emergency Medicine

## 2023-08-21 DIAGNOSIS — R0602 Shortness of breath: Secondary | ICD-10-CM | POA: Diagnosis not present

## 2023-08-21 DIAGNOSIS — R319 Hematuria, unspecified: Secondary | ICD-10-CM | POA: Diagnosis not present

## 2023-08-21 DIAGNOSIS — R079 Chest pain, unspecified: Secondary | ICD-10-CM | POA: Insufficient documentation

## 2023-08-21 DIAGNOSIS — R109 Unspecified abdominal pain: Secondary | ICD-10-CM | POA: Diagnosis not present

## 2023-08-21 DIAGNOSIS — M545 Low back pain, unspecified: Secondary | ICD-10-CM | POA: Diagnosis not present

## 2023-08-21 DIAGNOSIS — R55 Syncope and collapse: Secondary | ICD-10-CM | POA: Insufficient documentation

## 2023-08-21 DIAGNOSIS — R310 Gross hematuria: Secondary | ICD-10-CM | POA: Diagnosis not present

## 2023-08-21 DIAGNOSIS — R42 Dizziness and giddiness: Secondary | ICD-10-CM | POA: Diagnosis not present

## 2023-08-21 DIAGNOSIS — G8929 Other chronic pain: Secondary | ICD-10-CM | POA: Diagnosis not present

## 2023-08-21 DIAGNOSIS — K760 Fatty (change of) liver, not elsewhere classified: Secondary | ICD-10-CM | POA: Diagnosis not present

## 2023-08-21 LAB — COMPREHENSIVE METABOLIC PANEL
ALT: 32 U/L (ref 0–44)
AST: 24 U/L (ref 15–41)
Albumin: 4.4 g/dL (ref 3.5–5.0)
Alkaline Phosphatase: 96 U/L (ref 38–126)
Anion gap: 11 (ref 5–15)
BUN: 14 mg/dL (ref 6–20)
CO2: 22 mmol/L (ref 22–32)
Calcium: 9.2 mg/dL (ref 8.9–10.3)
Chloride: 104 mmol/L (ref 98–111)
Creatinine, Ser: 0.85 mg/dL (ref 0.44–1.00)
GFR, Estimated: >60 mL/min (ref >60)
Glucose, Bld: 114 mg/dL — ABNORMAL HIGH (ref 70–99)
Potassium: 3.6 mmol/L (ref 3.5–5.1)
Sodium: 137 mmol/L (ref 135–145)
Total Bilirubin: 0.3 mg/dL (ref 0.3–1.2)
Total Protein: 8.1 g/dL (ref 6.5–8.1)

## 2023-08-21 LAB — URINALYSIS, ROUTINE W REFLEX MICROSCOPIC
Bacteria, UA: NONE SEEN
Bilirubin Urine: NEGATIVE
Glucose, UA: NEGATIVE mg/dL
Ketones, ur: 5 mg/dL — AB
Leukocytes,Ua: NEGATIVE
Nitrite: NEGATIVE
Protein, ur: NEGATIVE mg/dL
Specific Gravity, Urine: 1.02 (ref 1.005–1.030)
pH: 6 (ref 5.0–8.0)

## 2023-08-21 LAB — CBC WITH DIFFERENTIAL/PLATELET
Abs Immature Granulocytes: 0.04 10*3/uL (ref 0.00–0.07)
Basophils Absolute: 0.1 10*3/uL (ref 0.0–0.1)
Basophils Relative: 1 %
Eosinophils Absolute: 0.1 10*3/uL (ref 0.0–0.5)
Eosinophils Relative: 1 %
HCT: 41.3 % (ref 36.0–46.0)
Hemoglobin: 13.5 g/dL (ref 12.0–15.0)
Immature Granulocytes: 0 %
Lymphocytes Relative: 24 %
Lymphs Abs: 2.8 10*3/uL (ref 0.7–4.0)
MCH: 28.7 pg (ref 26.0–34.0)
MCHC: 32.7 g/dL (ref 30.0–36.0)
MCV: 87.9 fL (ref 80.0–100.0)
Monocytes Absolute: 0.5 10*3/uL (ref 0.1–1.0)
Monocytes Relative: 4 %
Neutro Abs: 8.2 10*3/uL — ABNORMAL HIGH (ref 1.7–7.7)
Neutrophils Relative %: 70 %
Platelets: 352 10*3/uL (ref 150–400)
RBC: 4.7 MIL/uL (ref 3.87–5.11)
RDW: 13.9 % (ref 11.5–15.5)
WBC: 11.8 10*3/uL — ABNORMAL HIGH (ref 4.0–10.5)
nRBC: 0 % (ref 0.0–0.2)

## 2023-08-21 LAB — TSH: TSH: 3.596 u[IU]/mL (ref 0.350–4.500)

## 2023-08-21 LAB — LIPASE, BLOOD: Lipase: 47 U/L (ref 11–51)

## 2023-08-21 LAB — HCG, SERUM, QUALITATIVE: Preg, Serum: NEGATIVE

## 2023-08-21 MED ORDER — OXYCODONE-ACETAMINOPHEN 5-325 MG PO TABS
1.0000 | ORAL_TABLET | Freq: Once | ORAL | Status: AC
  Administered 2023-08-21: 1 via ORAL
  Filled 2023-08-21: qty 1

## 2023-08-21 MED ORDER — TRAMADOL HCL 50 MG PO TABS: 50.0000 mg | ORAL_TABLET | Freq: Four times a day (QID) | ORAL | 0 refills | Status: AC | PRN

## 2023-08-21 MED ORDER — CYCLOBENZAPRINE HCL 10 MG PO TABS: 10.0000 mg | ORAL_TABLET | Freq: Once | ORAL | 0 refills | Status: DC | PRN

## 2023-08-21 NOTE — Discharge Instructions (Addendum)
You have been seen and discharged from the emergency department.  Your blood work and kidney levels were normal.  Your urinalysis showed blood but no infection.  The CT scan showed no kidney stone but did identify some mild bladder inflammation.  Follow-up with your OB/GYN appointment for further information.  In regards to your lower back pain you have been prescribed medication for symptom control.  Do not mix these medications with alcohol or other sedating medications. Do not drive or do heavy physical activity until you know how these medications affects you.  It may cause drowsiness.  Establish care with a spine doctor for further evaluation and treatment.  The CT of the lumbar spine was unremarkable today but you may need more specialized imaging.  Follow-up with your primary provider for further evaluation and further care. Take home medications as prescribed. If you have any worsening symptoms or further concerns for your health please return to an emergency department for further evaluation.  You have also been given contact information for urology and gastroenterology as needed.

## 2023-08-21 NOTE — ED Provider Notes (Signed)
[place] [facility]MENT AT [facility] Provider Note   CSN: [id number] Arrival date & time: 08/21/23  2005     History  Chief Complaint  Patient presents with   Abdominal Pain   Hematuria    Patty Wilcox is a 27 y.o. female.  HPI   27 year old female presents emergency department with acute on chronic complaints.  Patient states that she has intermittent hematuria as well as acute on chronic lower back pain.  Patient states this has been going on for years.  She is following outpatient with OB/GYN for abnormal menstrual bleeding and has a follow-up appointment next week.  She comes today because the hematuria was worse that she started having back pain higher than her typical lower back pain.  She does have history of kidney stones.  Denies any acute dysuria or fever or vomiting.  No diarrhea.  The lower back pain has otherwise been stable with no new symptoms in the lower extremities.  Home Medications Prior to Admission medications   Medication Sig Start Date End Date Taking? Authorizing Provider  cyclobenzaprine (FLEXERIL) 10 MG tablet Take 1 tablet (10 mg total) by mouth once as needed for up to 1 dose for muscle spasms. 08/21/23  Yes Canda Podgorski, Clabe Seal, DO  traMADol (ULTRAM) 50 MG tablet Take 1 tablet (50 mg total) by mouth every 6 (six) hours as needed. 08/21/23  Yes Kishawn Pickar M, DO  albuterol (VENTOLIN HFA) 108 (90 Base) MCG/ACT inhaler Inhale 2 puffs into the lungs every 6 (six) hours as needed for wheezing or shortness of breath. 01/12/23 01/12/24  [provider]  buPROPion (WELLBUTRIN XL) 300 MG 24 hr tablet Take 1 tablet (300 mg total) by mouth every morning. 04/06/23 07/05/23  Bahraini, Sarah A  doxylamine, Sleep, (UNISOM) 25 MG tablet Take 25 mg by mouth at bedtime as needed for sleep.    [provider]  ibuprofen (ADVIL) 400 MG tablet Take 400 mg by mouth every 6 (six) hours as needed for headache or mild pain.    [provider]  meloxicam (MOBIC) 15 MG tablet Take 15 mg by mouth daily. 03/10/23   [provider]  nicotine (NICODERM CQ - DOSED IN MG/24 HOURS) 14 mg/24hr patch Place 1 patch (14 mg total) onto the skin daily. Remove before bedtime. 03/05/23   B[place]raini, Sarah A  predniSONE (STERAPRED UNI-PAK 21 TAB) 10 MG (21) TBPK tablet Take by mouth daily. Take 6 tabs by mouth daily  for 2 days, then 5 tabs for 2 days, then 4 tabs for 2 days, then 3 tabs for 2 days, 2 tabs for 2 days, then 1 tab by mouth daily for 2 days 06/21/23   Lorre Nick, MD      Allergies    Patient has no known allergies.    Review of Systems   Review of Systems  Constitutional:  Positive for appetite change and fatigue. Negative for fever.  Respiratory:  Negative for shortness of breath.   Cardiovascular:  Negative for chest pain.  Gastrointestinal:  Negative for abdominal pain, diarrhea and vomiting.  Genitourinary:  Positive for hematuria. Negative for dysuria and frequency.  Musculoskeletal:  Positive for back pain. Negative for neck pain.  Skin:  Negative for rash.  Neurological:  Negative for headaches.    Physical Exam Updated Vital Signs BP (!) 170/91   Pulse (!) 102   Temp 99.4 F (37.4 C) (Oral)   Resp 16   Ht 5' 3.5" (1.613  m)   Wt 108 kg   SpO2 100%   BMI 41.50 kg/m  Physical Exam Vitals and nursing note reviewed.  Constitutional:      General: She is not in acute distress.    Appearance: Normal appearance.  HENT:     Head: Normocephalic.     Mouth/Throat:     Mouth: Mucous membranes are moist.  Cardiovascular:     Rate and Rhythm: Normal rate.  Pulmonary:     Effort: Pulmonary effort is normal. No respiratory distress.  Abdominal:     Palpations: Abdomen is soft.     Tenderness: There is no abdominal tenderness. There is no guarding or rebound.  Musculoskeletal:     Comments: Diffuse tenderness to palpation of the lower back, paraspinal and extending up to the bilateral flanks  Skin:     General: Skin is warm.  Neurological:     Mental Status: She is alert and oriented to person, place, and time. Mental status is at baseline.  Psychiatric:        Mood and Affect: Mood normal.     ED Results / Procedures / Treatments   Labs (all labs ordered are listed, but only abnormal results are displayed) Labs Reviewed  COMPREHENSIVE METABOLIC PANEL - Abnormal; Notable for the following components:      Result Value   Glucose, Bld 114 (*)    All other components within normal limits  CBC WITH DIFFERENTIAL/PLATELET - Abnormal; Notable for the following components:   WBC 11.8 (*)    Neutro Abs 8.2 (*)    All other components within normal limits  URINALYSIS, ROUTINE W REFLEX MICROSCOPIC - Abnormal; Notable for the following components:   Color, Urine YELLOW (*)    APPearance CLEAR (*)    Hgb urine dipstick LARGE (*)    Ketones, ur 5 (*)    All other components within normal limits  LIPASE, BLOOD  HCG, SERUM, QUALITATIVE  TSH    EKG None  Radiology CT L-SPINE NO CHARGE  Result Date: 08/21/2023 CLINICAL DATA:  Chronic low back pain. Hematuria and abdominal pain for 1 week. EXAM: CT LUMBAR SPINE WITHOUT CONTRAST TECHNIQUE: Multidetector CT imaging of the lumbar spine was performed without intravenous contrast administration. Multiplanar CT image reconstructions were also generated. RADIATION DOSE REDUCTION: This exam was performed according to the departmental dose-optimization program which includes automated exposure control, adjustment of the mA and/or kV according to patient size and/or use of iterative reconstruction technique. COMPARISON:  Lumbar spine radiographs 05/14/2022. CT abdomen and pelvis 06/16/2023 FINDINGS: Segmentation: 4 true lumbar type vertebral bodies with atrophic ribs at the presumed T12 and a transitional fifth lumbosacral segment. Alignment: Normal alignment. Vertebrae: No acute fracture or focal pathologic process. Paraspinal and other soft tissues:  Negative. Disc levels: Intervertebral disc space heights are normal. IMPRESSION: Normal alignment of the lumbar spine. No acute displaced fractures identified. Electronically Signed   By: Burman Nieves M.D.   On: 08/21/2023 22:43   CT Renal Stone Study  Result Date: 08/21/2023 CLINICAL DATA:  Abdominal and flank pain with stone suspected. Chronic low back pain with hematuria and abdominal pain for 1 week. Possible kidney infection. EXAM: CT ABDOMEN AND PELVIS WITHOUT CONTRAST TECHNIQUE: Multidetector CT imaging of the abdomen and pelvis was performed following the standard protocol without IV contrast. RADIATION DOSE REDUCTION: This exam was performed according to the departmental dose-optimization program which includes automated exposure control, adjustment of the mA and/or kV according to patient size and/or use of  iterative reconstruction technique. COMPARISON:  06/16/2023 FINDINGS: Lower chest: Subpleural nodules in both lungs, largest measuring 5 mm diameter. No change since prior study. No imaging follow-up is indicated. Hepatobiliary: Mild diffuse fatty infiltration of the liver. No focal lesions identified. Gallbladder and bile ducts are normal. Pancreas: Unremarkable. No pancreatic ductal dilatation or surrounding inflammatory changes. Spleen: Normal in size without focal abnormality. Adrenals/Urinary Tract: Adrenal glands are unremarkable. Kidneys are normal, without renal calculi, focal lesion, or hydronephrosis. Bladder wall is mildly thickened. This may indicate cystitis. Correlate with urinalysis. Stomach/Bowel: Stomach is within normal limits. Appendix appears normal. No evidence of bowel wall thickening, distention, or inflammatory changes. Vascular/Lymphatic: No significant vascular findings are present. No enlarged abdominal or pelvic lymph nodes. Reproductive: Uterus and bilateral adnexa are unremarkable. Other: No abdominal wall hernia or abnormality. No abdominopelvic ascites.  Musculoskeletal: No acute or significant osseous findings. IMPRESSION: 1. Diffuse fatty infiltration of the liver. 2. No renal or ureteral stone or obstruction. 3. Mild diffuse bladder wall thickening may indicate cystitis. Correlate with urinalysis. 4. Pulmonary nodules measuring up to 5 mm diameter. No follow-up needed if patient is low-risk (and has no known or suspected primary neoplasm). Non-contrast chest CT can be considered in 12 months if patient is high-risk. This recommendation follows the consensus statement: Guidelines for Management of Incidental Pulmonary Nodules Detected on CT Images: From the Fleischner Society 2017; Radiology 2017; 284:228-243. Electronically Signed   By: Burman Nieves M.D.   On: 08/21/2023 22:39    Procedures Procedures    Medications Ordered in ED Medications  oxyCODONE-acetaminophen (PERCOCET/ROXICET) 5-325 MG per tablet 1 tablet (1 tablet Oral Given 08/21/23 2244)    ED Course/ Medical Decision Making/ A&P                                 Medical Decision Making Amount and/or Complexity of Data Reviewed Labs: ordered. Radiology: ordered.  Risk Prescription drug management.   27 year old female presents emergency department with acute on chronic hematuria and lower back pain that is extending up to her bilateral flanks.  History of kidney stones.  Afebrile and stable vitals on arrival.  Blood work shows a white blood cell count of 11.  Kidney function is normal.  Urinalysis identifies blood without signs of infection.  CT renal stone study shows no kidney stone but does identify some mild bladder thickening.  CT of the lumbar spine shows no acute finding.  Discussed with the patient continued outpatient follow-up with OB/GYN.  No indication for antibiotics in regards to bladder inflammation without a positive UA.  Will treat the lower back pain symptomatically and refer to outpatient spine.  Patient at this time appears safe and stable for  discharge and close outpatient follow up. Discharge plan and strict return to ED precautions discussed, patient verbalizes understanding and agreement.        Final Clinical Impression(s) / ED Diagnoses Final diagnoses:  Hematuria, unspecified type    Rx / DC Orders ED Discharge Orders          Ordered    cyclobenzaprine (FLEXERIL) 10 MG tablet  Once PRN        08/21/23 2345    traMADol (ULTRAM) 50 MG tablet  Every 6 hours PRN        08/21/23 2345              Joelynn Dust, Clabe Seal, DO 08/21/23 2350

## 2023-08-21 NOTE — ED Triage Notes (Signed)
Pt reports with lower back pain (chronic), hematuria, and abdominal pain x 1 week. Pt states that she thinks that she has a kidney infection.

## 2023-08-22 ENCOUNTER — Encounter (HOSPITAL_COMMUNITY): Payer: Self-pay

## 2023-08-22 ENCOUNTER — Other Ambulatory Visit: Payer: Self-pay

## 2023-08-22 ENCOUNTER — Emergency Department (HOSPITAL_COMMUNITY): Payer: Medicaid Other

## 2023-08-22 ENCOUNTER — Emergency Department (HOSPITAL_COMMUNITY)
Admission: EM | Admit: 2023-08-22 | Discharge: 2023-08-22 | Disposition: A | Discharge status: Home / Self Care | Payer: Medicaid Other | Source: Home / Self Care | Attending: Emergency Medicine | Admitting: Emergency Medicine

## 2023-08-22 DIAGNOSIS — R079 Chest pain, unspecified: Secondary | ICD-10-CM | POA: Insufficient documentation

## 2023-08-22 DIAGNOSIS — R110 Nausea: Secondary | ICD-10-CM | POA: Diagnosis not present

## 2023-08-22 DIAGNOSIS — R310 Gross hematuria: Secondary | ICD-10-CM | POA: Insufficient documentation

## 2023-08-22 DIAGNOSIS — R0602 Shortness of breath: Secondary | ICD-10-CM | POA: Insufficient documentation

## 2023-08-22 DIAGNOSIS — R457 State of emotional shock and stress, unspecified: Secondary | ICD-10-CM | POA: Diagnosis not present

## 2023-08-22 DIAGNOSIS — R55 Syncope and collapse: Secondary | ICD-10-CM | POA: Insufficient documentation

## 2023-08-22 DIAGNOSIS — R1084 Generalized abdominal pain: Secondary | ICD-10-CM | POA: Diagnosis not present

## 2023-08-22 DIAGNOSIS — R000 Tachycardia, unspecified: Secondary | ICD-10-CM | POA: Diagnosis not present

## 2023-08-22 DIAGNOSIS — R42 Dizziness and giddiness: Secondary | ICD-10-CM | POA: Diagnosis not present

## 2023-08-22 LAB — D-DIMER, QUANTITATIVE: D-Dimer, Quant: 0.47 ug{FEU}/mL (ref 0.00–0.50)

## 2023-08-22 LAB — TROPONIN I (HIGH SENSITIVITY)
Troponin I (High Sensitivity): 2 ng/L (ref <18)
Troponin I (High Sensitivity): <2 ng/L (ref <18)

## 2023-08-22 LAB — CBG MONITORING, ED: Glucose-Capillary: 126 mg/dL — ABNORMAL HIGH (ref 70–99)

## 2023-08-22 MED ORDER — CEPHALEXIN 500 MG PO CAPS: 500.0000 mg | ORAL_CAPSULE | Freq: Two times a day (BID) | ORAL | 0 refills | Status: AC

## 2023-08-22 MED ORDER — HYDROXYZINE HCL 25 MG PO TABS: 25.0000 mg | ORAL_TABLET | Freq: Four times a day (QID) | ORAL | 0 refills | Status: AC

## 2023-08-22 MED ORDER — LORAZEPAM 1 MG PO TABS
1.0000 mg | ORAL_TABLET | Freq: Once | ORAL | Status: AC
  Administered 2023-08-22: 1 mg via ORAL
  Filled 2023-08-22: qty 1

## 2023-08-22 NOTE — ED Provider Notes (Signed)
[place] [facility]ENC[facility] DEPARTMENT AT [facility] Provider Note   CSN: [id number] Arrival date & time: 08/22/23  0126     History  Chief Complaint  Patient presents with   Dizziness    Patty Wilcox is a 27 y.o. female.  HPI   Patient with medical history including obesity, depression, anxiety, thyroid disease, presenting with complaints of chest pain and shortness of breath.  Patient states that she was just seen in the emergency department for concerns of abdominal pain, states that she was diagnosed with a UTI, states on her way home she started to have some chest pressure shortness of breath, she states then she started to feel paresthesias in her hands and feet, then almost fell as if her throat was closing up.  She states this is felt worse than her normal panic attack.  States that she is currently feeling better now.  She states that she still has some shortness of breath, no history of PEs or DVTs on oral birth control no recent surgeries no long immobilizations no cardiac history.  Reviewed patient's chart initially seen earlier today because she has been having intermittent hematuria as well as acute on chronic lower back pain, this has been going on for years, followed by OB/GYN, she has an appoint with them next week, patient had initial lab workup which was unremarkable, CT renal as well as lumbar was both negative for acute findings    Home Medications Prior to Admission medications   Medication Sig Start Date End Date Taking? Authorizing Provider  albuterol (VENTOLIN HFA) 108 (90 Base) MCG/ACT inhaler Inhale 2 puffs into the lungs every 6 (six) hours as needed for wheezing or shortness of breath. 01/12/23 01/12/24 Yes [provider]  cephALEXin (KEFLEX) 500 MG capsule Take 1 capsule (500 mg total) by mouth 2 (two) times daily for 7 days. 08/22/23 08/29/23 Yes Carroll Sage, PA-C  hydrOXYzine (ATARAX) 25 MG tablet Take 1 tablet (25 mg total) by mouth  every 6 (six) hours. 08/22/23  Yes Carroll Sage, PA-C  methocarbamol (ROBAXIN) 750 MG tablet Take 750 mg by mouth 2 (two) times daily as needed for muscle spasms. 07/13/23  Yes [provider]  traMADol (ULTRAM) 50 MG tablet Take 1 tablet (50 mg total) by mouth every 6 (six) hours as needed. 08/21/23  Yes [name], Kristie M, DO  buPROPion (WELLBUTRIN XL) 300 MG 24 hr tablet Take 1 tablet (300 mg total) by mouth every morning. 04/06/23 07/05/23  Bahraini, Sarah A  cyclobenzaprine (FLEXERIL) 10 MG tablet Take 1 tablet (10 mg total) by mouth once as needed for up to 1 dose for muscle spasms. Patient not taking: Reported on 08/22/2023 08/21/23   [name], Kristie M, DO  nicotine (NICODERM CQ - DOSED IN MG/24 HOURS) 14 mg/24hr patch Place 1 patch (14 mg total) onto the skin daily. Remove before bedtime. Patient not taking: Reported on 08/22/2023 03/05/23   Daine Gip  predniSONE (STERAPRED UNI-PAK 21 TAB) 10 MG (21) TBPK tablet Take by mouth daily. Take 6 tabs by mouth daily  for 2 days, then 5 tabs for 2 days, then 4 tabs for 2 days, then 3 tabs for 2 days, 2 tabs for 2 days, then 1 tab by mouth daily for 2 days Patient not taking: Reported on 08/22/2023 06/21/23   Lorre Nick, MD      Allergies    Patient has no known allergies.    Review of Systems   Review of  Systems  Constitutional:  Negative for chills and fever.  Respiratory:  Positive for chest tightness and shortness of breath.   Cardiovascular:  Negative for chest pain.  Gastrointestinal:  Negative for abdominal pain.  Neurological:  Negative for headaches.    Physical Exam Updated Vital Signs BP 119/77   Pulse 94   Temp 98.1 F (36.7 C) (Oral)   Resp 20   SpO2 98%  Physical Exam Vitals and nursing note reviewed.  Constitutional:      General: She is not in acute distress.    Appearance: She is not ill-appearing.  HENT:     Head: Normocephalic and atraumatic.     Nose: No congestion.  Eyes:      Conjunctiva/sclera: Conjunctivae normal.  Cardiovascular:     Rate and Rhythm: Normal rate and regular rhythm.     Pulses: Normal pulses.     Heart sounds: No murmur heard.    No friction rub. No gallop.  Pulmonary:     Effort: No respiratory distress.     Breath sounds: No wheezing, rhonchi or rales.     Comments: No evidence of acute abnormalities of the chest, chest has some slight tenderness mainly on the mid could likely or along the seventh rib, lung sounds are clear bilaterally. Abdominal:     Palpations: Abdomen is soft.     Tenderness: There is abdominal tenderness. There is no right CVA tenderness or left CVA tenderness.     Comments: Abdomen nondistended, soft, minimal tenderness mainly in the left upper quadrant just beneath the bottom of the rib cage, pain is focalized reproducible, she has no guarding rebound nausea peritoneal sign.  Musculoskeletal:     Right lower leg: No edema.     Left lower leg: No edema.     Comments: No unilateral leg swelling no calf tenderness no palpable cords.  Skin:    General: Skin is warm and dry.  Neurological:     Mental Status: She is alert.  Psychiatric:        Mood and Affect: Mood normal.     Comments: Patient appears to be anxious on my exam, patient is responding appropriately, does not appear to be respond to internal stimuli.     ED Results / Procedures / Treatments   Labs (all labs ordered are listed, but only abnormal results are displayed) Labs Reviewed  CBG MONITORING, ED - Abnormal; Notable for the following components:      Result Value   Glucose-Capillary 126 (*)    All other components within normal limits  D-DIMER, QUANTITATIVE  TROPONIN I (HIGH SENSITIVITY)  TROPONIN I (HIGH SENSITIVITY)    EKG None  Radiology DG Chest 2 View  Result Date: 08/22/2023 CLINICAL DATA:  Chest pain, dizziness. EXAM: CHEST - 2 VIEW COMPARISON:  None Available. FINDINGS: The heart size and mediastinal contours are within normal  limits. No consolidation, effusion, or pneumothorax. No acute osseous abnormality. IMPRESSION: No active cardiopulmonary disease. Electronically Signed   By: Thornell Sartorius M.D.   On: 08/22/2023 04:17   CT L-SPINE NO CHARGE  Result Date: 08/21/2023 CLINICAL DATA:  Chronic low back pain. Hematuria and abdominal pain for 1 week. EXAM: CT LUMBAR SPINE WITHOUT CONTRAST TECHNIQUE: Multidetector CT imaging of the lumbar spine was performed without intravenous contrast administration. Multiplanar CT image reconstructions were also generated. RADIATION DOSE REDUCTION: This exam was performed according to the departmental dose-optimization program which includes automated exposure control, adjustment of the mA and/or kV according to  patient size and/or use of iterative reconstruction technique. COMPARISON:  Lumbar spine radiographs 05/14/2022. CT abdomen and pelvis 06/16/2023 FINDINGS: Segmentation: 4 true lumbar type vertebral bodies with atrophic ribs at the presumed T12 and a transitional fifth lumbosacral segment. Alignment: Normal alignment. Vertebrae: No acute fracture or focal pathologic process. Paraspinal and other soft tissues: Negative. Disc levels: Intervertebral disc space heights are normal. IMPRESSION: Normal alignment of the lumbar spine. No acute displaced fractures identified. Electronically Signed   By: Burman Nieves M.D.   On: 08/21/2023 22:43   CT Renal Stone Study  Result Date: 08/21/2023 CLINICAL DATA:  Abdominal and flank pain with stone suspected. Chronic low back pain with hematuria and abdominal pain for 1 week. Possible kidney infection. EXAM: CT ABDOMEN AND PELVIS WITHOUT CONTRAST TECHNIQUE: Multidetector CT imaging of the abdomen and pelvis was performed following the standard protocol without IV contrast. RADIATION DOSE REDUCTION: This exam was performed according to the departmental dose-optimization program which includes automated exposure control, adjustment of the mA and/or kV  according to patient size and/or use of iterative reconstruction technique. COMPARISON:  06/16/2023 FINDINGS: Lower chest: Subpleural nodules in both lungs, largest measuring 5 mm diameter. No change since prior study. No imaging follow-up is indicated. Hepatobiliary: Mild diffuse fatty infiltration of the liver. No focal lesions identified. Gallbladder and bile ducts are normal. Pancreas: Unremarkable. No pancreatic ductal dilatation or surrounding inflammatory changes. Spleen: Normal in size without focal abnormality. Adrenals/Urinary Tract: Adrenal glands are unremarkable. Kidneys are normal, without renal calculi, focal lesion, or hydronephrosis. Bladder wall is mildly thickened. This may indicate cystitis. Correlate with urinalysis. Stomach/Bowel: Stomach is within normal limits. Appendix appears normal. No evidence of bowel wall thickening, distention, or inflammatory changes. Vascular/Lymphatic: No significant vascular findings are present. No enlarged abdominal or pelvic lymph nodes. Reproductive: Uterus and bilateral adnexa are unremarkable. Other: No abdominal wall hernia or abnormality. No abdominopelvic ascites. Musculoskeletal: No acute or significant osseous findings. IMPRESSION: 1. Diffuse fatty infiltration of the liver. 2. No renal or ureteral stone or obstruction. 3. Mild diffuse bladder wall thickening may indicate cystitis. Correlate with urinalysis. 4. Pulmonary nodules measuring up to 5 mm diameter. No follow-up needed if patient is low-risk (and has no known or suspected primary neoplasm). Non-contrast chest CT can be considered in 12 months if patient is high-risk. This recommendation follows the consensus statement: Guidelines for Management of Incidental Pulmonary Nodules Detected on CT Images: From the Fleischner Society 2017; Radiology 2017; 284:228-243. Electronically Signed   By: Burman Nieves M.D.   On: 08/21/2023 22:39    Procedures Procedures    Medications Ordered in  ED Medications  LORazepam (ATIVAN) tablet 1 mg (1 mg Oral Given 08/22/23 0326)    ED Course/ Medical Decision Making/ A&P                                 Medical Decision Making Amount and/or Complexity of Data Reviewed Labs: ordered. Radiology: ordered.  Risk Prescription drug management.   This patient presents to the ED for concern of chest pain,, this involves an extensive number of treatment options, and is a complaint that carries with it a high risk of complications and morbidity.  The differential diagnosis includes ACS, PE, dissection, pneumonia,    Additional history obtained:  Additional history obtained from friend at bedside External records from outside source obtained and reviewed including recent ER note   Co morbidities that complicate the patient  evaluation  Anxiety  Social Determinants of Health:  N/A    Lab Tests:  I Ordered, and personally interpreted labs.  The pertinent results include: Negative delta troponin, negative D-dimer   Imaging Studies ordered:  I ordered imaging studies including chest x-ray I independently visualized and interpreted imaging which showed unremarkable I agree with the radiologist interpretation   Cardiac Monitoring:  The patient was maintained on a cardiac monitor.  I personally viewed and interpreted the cardiac monitored which showed an underlying rhythm of: EKG without signs of ischemia   Medicines ordered and prescription drug management:  I ordered medication including Ativan I have reviewed the patients home medicines and have made adjustments as needed  Critical Interventions:  N/A   Reevaluation:  Presents with chest pain shortness of breath, patient is very anxious my exam, presentation seems consistent with likely a panic attack, but due to her complaints of shortness of breath, slightly tachycardic, possible cardiac/PE, will obtain further workup.   Patient is reassessed, states that she  still is having some hematuria, states that she only has it with urination, denies any vaginal discharge or vaginal bleeding, not on birth control, states that she has irregular and heavy menses, currently have no pelvic pain, endorsing some urinary symptoms urinary frequency hematuria, will start antibiotics.  Patient agreed this plan.  Consultations Obtained:  N/a    Test Considered:  N/a    Rule out I have low suspicion for ACS as history is atypical, patient has no cardiac history, EKG was sinus rhythm without signs of ischemia, patient has a negative delta troponin.  Low suspicion for PE as patient denies pleuritic chest pain, shortness of breath, patient denies leg pain, no pedal edema noted on exam, patient has a negative D-dimer.  Low suspicion for AAA or aortic dissection as history is atypical, patient has low risk factors.  Low suspicion for systemic infection as patient is nontoxic-appearing, vital signs reassuring, no obvious source infection noted on exam.  Suspicion for ectopic pregnancy is low as your pregnancy is negative.  I doubt ovarian torsion she has no suprapubic pain adnexal pain presentation atypical, CT scan does not reveal any adnexal mass.  I doubt PID not endorsing any vaginal discharge or pelvic pain.     Dispostion and problem list  After consideration of the diagnostic results and the patients response to treatment, I feel that the patent would benefit from discharge.  Hematuria-unclear etiology, possible infectious etiology as she is having urinary symptoms with evidence of inflammation seen on CT imaging, will start on antibiotics.  Encouraged her follow-up with her OB/GYN next week as well as seek evaluation by urology. Anxiety-suspect patient has underlying Estelle June causing her initial symptoms, will provide her with anxiety medication, follow with her primary doctor for reassessment.            Final Clinical Impression(s) / ED Diagnoses Final  diagnoses:  Near syncope  Gross hematuria    Rx / DC Orders ED Discharge Orders          Ordered    cephALEXin (KEFLEX) 500 MG capsule  2 times daily        08/22/23 0554    hydrOXYzine (ATARAX) 25 MG tablet  Every 6 hours        08/22/23 0554              Carroll Sage, PA-C 08/22/23 0555    Nira Conn, MD 08/23/23 [phone]

## 2023-08-22 NOTE — ED Triage Notes (Signed)
Pt was in the driveway and started to feel dizzy and very shaky. HX of Anxiety

## 2023-08-22 NOTE — Discharge Instructions (Signed)
Possible you have a UTI, I am starting on antibiotics please take as prescribed.  I encourage you to go to your OB/GYN appointment next week, I have also referred you to urology, I recommend follow-up with both for further assessment of your urinary frequency and bloody urine. Anxiety-suspect you have some underlying anxiety, I have given you medication called Vistaril to take as needed for anxiety attacks, may follow-up with behavioral health for further assessment.  Come back to the emergency department if you develop chest pain, shortness of breath, severe abdominal pain, uncontrolled nausea, vomiting, diarrhea.

## 2023-08-23 DIAGNOSIS — R0789 Other chest pain: Secondary | ICD-10-CM | POA: Diagnosis not present

## 2023-08-23 DIAGNOSIS — N3289 Other specified disorders of bladder: Secondary | ICD-10-CM | POA: Diagnosis not present

## 2023-08-23 DIAGNOSIS — N926 Irregular menstruation, unspecified: Secondary | ICD-10-CM | POA: Diagnosis not present

## 2023-08-24 DIAGNOSIS — R310 Gross hematuria: Secondary | ICD-10-CM | POA: Diagnosis not present

## 2023-08-31 DIAGNOSIS — Z79899 Other long term (current) drug therapy: Secondary | ICD-10-CM | POA: Diagnosis not present

## 2023-09-02 DIAGNOSIS — Z79899 Other long term (current) drug therapy: Secondary | ICD-10-CM | POA: Diagnosis not present

## 2023-09-11 DIAGNOSIS — Z79899 Other long term (current) drug therapy: Secondary | ICD-10-CM | POA: Diagnosis not present

## 2023-09-15 DIAGNOSIS — I499 Cardiac arrhythmia, unspecified: Secondary | ICD-10-CM | POA: Diagnosis not present

## 2023-09-15 DIAGNOSIS — Z136 Encounter for screening for cardiovascular disorders: Secondary | ICD-10-CM | POA: Diagnosis not present

## 2023-09-15 DIAGNOSIS — R072 Precordial pain: Secondary | ICD-10-CM | POA: Diagnosis not present

## 2023-09-15 DIAGNOSIS — R0609 Other forms of dyspnea: Secondary | ICD-10-CM | POA: Diagnosis not present

## 2023-09-22 ENCOUNTER — Other Ambulatory Visit: Payer: Self-pay

## 2023-09-22 ENCOUNTER — Ambulatory Visit (INDEPENDENT_AMBULATORY_CARE_PROVIDER_SITE_OTHER): Payer: Medicaid Other | Admitting: Nurse Practitioner

## 2023-09-22 VITALS — BP 125/82 | HR 92 | Temp 98.6°F | Resp 12 | Ht 63.0 in | Wt 229.4 lb

## 2023-09-22 DIAGNOSIS — Z6841 Body Mass Index (BMI) 40.0 and over, adult: Secondary | ICD-10-CM | POA: Diagnosis not present

## 2023-09-22 DIAGNOSIS — R1310 Dysphagia, unspecified: Secondary | ICD-10-CM

## 2023-09-22 DIAGNOSIS — E66813 Obesity, class 3: Secondary | ICD-10-CM

## 2023-09-22 DIAGNOSIS — N926 Irregular menstruation, unspecified: Secondary | ICD-10-CM | POA: Diagnosis not present

## 2023-09-22 DIAGNOSIS — R739 Hyperglycemia, unspecified: Secondary | ICD-10-CM | POA: Diagnosis not present

## 2023-09-22 DIAGNOSIS — K529 Noninfective gastroenteritis and colitis, unspecified: Secondary | ICD-10-CM | POA: Diagnosis not present

## 2023-09-22 LAB — POCT GLYCOSYLATED HEMOGLOBIN (HGB A1C): HbA1c, POC (prediabetic range): 5.6 % — AB (ref 5.7–6.4)

## 2023-09-22 MED ORDER — SEMAGLUTIDE(0.25 OR 0.5MG/DOS) 2 MG/3ML ~~LOC~~ SOPN
0.2500 mg | PEN_INJECTOR | SUBCUTANEOUS | 1 refills | Status: DC
Start: 2023-09-22 — End: 2023-10-07

## 2023-09-22 NOTE — Patient Instructions (Signed)
1. Dysphagia, unspecified type  - Ambulatory referral to Gastroenterology  2. Chronic diarrhea  - Ambulatory referral to Gastroenterology  3. Irregular menstrual bleeding  - Ambulatory referral to Obstetrics / Gynecology - CBC - Comprehensive metabolic panel - Thyroid Panel With TSH  4. Elevated random blood glucose level  - Hemoglobin A1c  Follow up:  Follow up in 3 months

## 2023-09-22 NOTE — Progress Notes (Addendum)
Subjective   Patient ID: Patty Wilcox, female    DOB: 17-Aug-1996, 27 y.o.   MRN: [id number]  Chief Complaint  Patient presents with   Establish Care    Referring provider: No ref. provider found  Patty Wilcox is a 27 y.o. female with Past Medical History: No date: ADHD No date: Anxiety No date: Depression No date: Obesity No date: Thyroid disease No date: Ureterolithiasis   HPI  Patient presents today to establish care.  She does have a history of ADHD and is followed by mental health through [facility].  Patient states she did have a history of heavy drinking but has quit since starting Adderall.  Patient does complain today about trouble with swallowing for the past year.  Sometimes she does have to vomit her food up.  We will refer her to GI.  Patient is also requesting a referral to OB/GYN for irregular menstrual cycles.  Patient would like to start on Ozempic for weight loss.  We discussed that this may not be covered through her insurance.  A1c today is 5.6.  Patient is currently and will continue lifestyle changes(diet and exercise). She states that despite lifestyle changes she is not losing weight. Denies f/c/s, n/v/d, hemoptysis, PND, leg swelling Denies chest pain or edema        No Known Allergies  Immunization History  Administered Date(s) Administered   Influenza Split 12/13/2011    Tobacco History: Social History   Tobacco Use  Smoking Status Former   Current packs/day: 0.15   Average packs/day: 0.2 packs/day for 10.0 years (1.5 ttl pk-yrs)   Types: Cigarettes, E-cigarettes  Smokeless Tobacco Former   Counseling given: Not Answered   Outpatient Encounter Medications as of 09/22/2023  Medication Sig   amphetamine-dextroamphetamine (ADDERALL XR) 20 MG 24 hr capsule Take 20 mg by mouth daily.   omeprazole (PRILOSEC) 20 MG capsule Take 20 mg by mouth daily.   albuterol (VENTOLIN HFA) 108 (90 Base) MCG/ACT inhaler Inhale 2 puffs into the lungs  every 6 (six) hours as needed for wheezing or shortness of breath. (Patient not taking: Reported on 09/22/2023)   buPROPion (WELLBUTRIN XL) 300 MG 24 hr tablet Take 1 tablet (300 mg total) by mouth every morning.   hydrOXYzine (ATARAX) 25 MG tablet Take 1 tablet (25 mg total) by mouth every 6 (six) hours. (Patient not taking: Reported on 09/22/2023)   nicotine (NICODERM CQ - DOSED IN MG/24 HOURS) 14 mg/24hr patch Place 1 patch (14 mg total) onto the skin daily. Remove before bedtime. (Patient not taking: Reported on 08/22/2023)   traMADol (ULTRAM) 50 MG tablet Take 1 tablet (50 mg total) by mouth every 6 (six) hours as needed. (Patient not taking: Reported on 09/22/2023)   [DISCONTINUED] cyclobenzaprine (FLEXERIL) 10 MG tablet Take 1 tablet (10 mg total) by mouth once as needed for up to 1 dose for muscle spasms. (Patient not taking: Reported on 08/22/2023)   [DISCONTINUED] methocarbamol (ROBAXIN) 750 MG tablet Take 750 mg by mouth 2 (two) times daily as needed for muscle spasms.   [DISCONTINUED] predniSONE (STERAPRED UNI-PAK 21 TAB) 10 MG (21) TBPK tablet Take by mouth daily. Take 6 tabs by mouth daily  for 2 days, then 5 tabs for 2 days, then 4 tabs for 2 days, then 3 tabs for 2 days, 2 tabs for 2 days, then 1 tab by mouth daily for 2 days (Patient not taking: Reported on 08/22/2023)   No facility-administered encounter medications on file as of  09/22/2023.    Review of Systems  Review of Systems  Constitutional: Negative.   HENT: Negative.    Cardiovascular: Negative.   Gastrointestinal: Negative.   Allergic/Immunologic: Negative.   Neurological: Negative.   Psychiatric/Behavioral: Negative.       Objective:   BP 125/82 (BP Location: Left Arm, Patient Position: Sitting, Cuff Size: Large)   Pulse 92   Temp 98.6 F (37 C)   Resp 12   Ht 5\' 3"  (1.6 m)   Wt 229 lb 6.4 oz (104.1 kg)   LMP 09/21/2023   SpO2 100%   BMI 40.64 kg/m   Wt Readings from Last 5 Encounters:  09/22/23 229 lb  6.4 oz (104.1 kg)  08/21/23 238 lb (108 kg)  06/21/23 225 lb (102.1 kg)  06/15/23 225 lb (102.1 kg)  04/20/23 236 lb (107 kg)     Physical Exam Vitals and nursing note reviewed.  Constitutional:      General: She is not in acute distress.    Appearance: She is well-developed.  Cardiovascular:     Rate and Rhythm: Normal rate and regular rhythm.  Pulmonary:     Effort: Pulmonary effort is normal.     Breath sounds: Normal breath sounds.  Neurological:     Mental Status: She is alert and oriented to person, place, and time.       Assessment & Plan:   Dysphagia, unspecified type -     Ambulatory referral to Gastroenterology  Chronic diarrhea -     Ambulatory referral to Gastroenterology  Irregular menstrual bleeding -     Ambulatory referral to Obstetrics / Gynecology -     CBC -     Comprehensive metabolic panel -     Thyroid Panel With TSH  Elevated random blood glucose level -     Hemoglobin A1c     Return in about 3 months (around 12/23/2023).   Ivonne Andrew, NP 09/22/2023

## 2023-09-22 NOTE — Progress Notes (Signed)
Pt is here to est care  Complaining of issues losing weight, wants to discuss options for same   Menstrual complaints, irregular periods coming more than once a month or skips a month

## 2023-09-23 LAB — COMPREHENSIVE METABOLIC PANEL
ALT: 23 [IU]/L (ref 0–32)
AST: 18 [IU]/L (ref 0–40)
Albumin: 4.5 g/dL (ref 4.0–5.0)
Alkaline Phosphatase: 112 [IU]/L (ref 44–121)
BUN/Creatinine Ratio: 11 (ref 9–23)
BUN: 9 mg/dL (ref 6–20)
Bilirubin Total: 0.3 mg/dL (ref 0.0–1.2)
CO2: 19 mmol/L — ABNORMAL LOW (ref 20–29)
Calcium: 9.6 mg/dL (ref 8.7–10.2)
Chloride: 103 mmol/L (ref 96–106)
Creatinine, Ser: 0.83 mg/dL (ref 0.57–1.00)
Globulin, Total: 2.5 g/dL (ref 1.5–4.5)
Glucose: 102 mg/dL — ABNORMAL HIGH (ref 70–99)
Potassium: 4.3 mmol/L (ref 3.5–5.2)
Sodium: 138 mmol/L (ref 134–144)
Total Protein: 7 g/dL (ref 6.0–8.5)
eGFR: 100 mL/min/{1.73_m2} (ref >59)

## 2023-09-23 LAB — CBC
Hematocrit: 37.1 % (ref 34.0–46.6)
Hemoglobin: 12.1 g/dL (ref 11.1–15.9)
MCH: 29.2 pg (ref 26.6–33.0)
MCHC: 32.6 g/dL (ref 31.5–35.7)
MCV: 89 fL (ref 79–97)
Platelets: 286 10*3/uL (ref 150–450)
RBC: 4.15 x10E6/uL (ref 3.77–5.28)
RDW: 13.1 % (ref 11.7–15.4)
WBC: 8.4 10*3/uL (ref 3.4–10.8)

## 2023-09-23 LAB — HEMOGLOBIN A1C
Est. average glucose Bld gHb Est-mCnc: 120 mg/dL
Hgb A1c MFr Bld: 5.8 % — ABNORMAL HIGH (ref 4.8–5.6)

## 2023-09-23 LAB — THYROID PANEL WITH TSH
Free Thyroxine Index: 2 (ref 1.2–4.9)
T3 Uptake Ratio: 27 % (ref 24–39)
T4, Total: 7.4 ug/dL (ref 4.5–12.0)
TSH: 2.88 u[IU]/mL (ref 0.450–4.500)

## 2023-09-26 DIAGNOSIS — Z79899 Other long term (current) drug therapy: Secondary | ICD-10-CM | POA: Diagnosis not present

## 2023-09-26 DIAGNOSIS — Z6838 Body mass index (BMI) 38.0-38.9, adult: Secondary | ICD-10-CM | POA: Diagnosis not present

## 2023-09-26 DIAGNOSIS — F332 Major depressive disorder, recurrent severe without psychotic features: Secondary | ICD-10-CM | POA: Diagnosis not present

## 2023-09-26 DIAGNOSIS — F411 Generalized anxiety disorder: Secondary | ICD-10-CM | POA: Diagnosis not present

## 2023-09-26 DIAGNOSIS — Z3200 Encounter for pregnancy test, result unknown: Secondary | ICD-10-CM | POA: Diagnosis not present

## 2023-09-26 DIAGNOSIS — F909 Attention-deficit hyperactivity disorder, unspecified type: Secondary | ICD-10-CM | POA: Diagnosis not present

## 2023-09-30 NOTE — Telephone Encounter (Signed)
Spoke to pt and she advised that her symptoms may be from taking her add medications and her heart burn medication. Has referral for GI and will speak to prescriber of the add medications. KH

## 2023-10-02 DIAGNOSIS — Z79899 Other long term (current) drug therapy: Secondary | ICD-10-CM | POA: Diagnosis not present

## 2023-10-04 DIAGNOSIS — Z136 Encounter for screening for cardiovascular disorders: Secondary | ICD-10-CM | POA: Diagnosis not present

## 2023-10-04 DIAGNOSIS — R072 Precordial pain: Secondary | ICD-10-CM | POA: Diagnosis not present

## 2023-10-05 DIAGNOSIS — Z79899 Other long term (current) drug therapy: Secondary | ICD-10-CM | POA: Diagnosis not present

## 2023-10-07 ENCOUNTER — Other Ambulatory Visit: Payer: Self-pay | Admitting: Nurse Practitioner

## 2023-10-07 ENCOUNTER — Other Ambulatory Visit: Payer: Self-pay

## 2023-10-07 MED ORDER — WEGOVY 0.25 MG/0.5ML ~~LOC~~ SOAJ: 0.2500 mg | SUBCUTANEOUS | 2 refills | Status: AC

## 2023-10-08 ENCOUNTER — Other Ambulatory Visit: Payer: Self-pay

## 2023-10-27 ENCOUNTER — Ambulatory Visit (INDEPENDENT_AMBULATORY_CARE_PROVIDER_SITE_OTHER): Payer: Self-pay | Admitting: Nurse Practitioner

## 2023-10-27 ENCOUNTER — Encounter: Payer: Self-pay | Admitting: Nurse Practitioner

## 2023-10-27 VITALS — BP 132/76 | HR 105 | Temp 98.7°F | Resp 12 | Ht 63.0 in | Wt 225.0 lb

## 2023-10-27 DIAGNOSIS — N926 Irregular menstruation, unspecified: Secondary | ICD-10-CM

## 2023-10-27 DIAGNOSIS — R55 Syncope and collapse: Secondary | ICD-10-CM

## 2023-10-27 DIAGNOSIS — R911 Solitary pulmonary nodule: Secondary | ICD-10-CM

## 2023-10-27 DIAGNOSIS — R42 Dizziness and giddiness: Secondary | ICD-10-CM

## 2023-10-27 NOTE — Progress Notes (Signed)
Pt describes an episode feeling of passing out, tingling sensation in her lower body, feeling of her heart racing,  cold/clammy hands, trouble breathing/heavy chest, sharp pain on the left side of her chest this episode last X3 hours   Feeling of fainting gets worse with standing  Trouble swallowing food  Irregular menstrual cycles   Requesting referral to an OBGYN for irregular cycles for several years

## 2023-10-27 NOTE — Progress Notes (Signed)
Subjective   Patient ID: Patty Wilcox, female    DOB: 1995-11-26, 27 y.o.   MRN: [id number]  Chief Complaint  Patient presents with   Follow-up    Referring provider: No ref. provider found  Patty Wilcox is a 27 y.o. female with Past Medical History: No date: ADHD No date: Anxiety No date: Depression No date: Obesity No date: Thyroid disease No date: Ureterolithiasis   HPI  Pt describes an episode feeling of passing out, tingling sensation in her lower body, feeling of her heart racing,  cold/clammy hands, trouble breathing/heavy chest, sharp pain on the left side of her chest this episode last X3 hours yesterday.  She was seen in the ED in [place].  She was diagnosed with flu a and was prescribed Tamiflu.  She does not think that these issues were related to the flu.  We will place a referral to neurology for patient due to multiple presyncopal episodes.  She has had a complete workup by cardiology through [facility] which was negative.  We will also place a referral to pulmonary for her due to lung nodule seen on last CT scan.  We discussed that these nodules looked benign but patient does continue to smoke and vape.  Patient is also requesting a referral to OB/GYN for irregular menstrual cycles. Denies f/c/s, n/v/d, hemoptysis, PND, leg swelling Denies chest pain or edema        No Known Allergies  Immunization History  Administered Date(s) Administered   Influenza Split 12/13/2011    Tobacco History: Social History   Tobacco Use  Smoking Status Former   Current packs/day: 0.15   Average packs/day: 0.2 packs/day for 10.0 years (1.5 ttl pk-yrs)   Types: Cigarettes, E-cigarettes  Smokeless Tobacco Former   Counseling given: Not Answered   Outpatient Encounter Medications as of 10/27/2023  Medication Sig   amphetamine-dextroamphetamine (ADDERALL XR) 20 MG 24 hr capsule Take 20 mg by mouth daily.   hydrOXYzine (ATARAX) 25 MG tablet Take 1 tablet (25 mg total)  by mouth every 6 (six) hours.   omeprazole (PRILOSEC) 20 MG capsule Take 20 mg by mouth daily.   albuterol (VENTOLIN HFA) 108 (90 Base) MCG/ACT inhaler Inhale 2 puffs into the lungs every 6 (six) hours as needed for wheezing or shortness of breath. (Patient not taking: Reported on 09/22/2023)   nicotine (NICODERM CQ - DOSED IN MG/24 HOURS) 14 mg/24hr patch Place 1 patch (14 mg total) onto the skin daily. Remove before bedtime. (Patient not taking: Reported on 08/22/2023)   Semaglutide-Weight Management (WEGOVY) 0.25 MG/0.5ML SOAJ Inject 0.25 mg into the skin once a week. (Patient not taking: Reported on 10/27/2023)   traMADol (ULTRAM) 50 MG tablet Take 1 tablet (50 mg total) by mouth every 6 (six) hours as needed. (Patient not taking: Reported on 09/22/2023)   [DISCONTINUED] buPROPion (WELLBUTRIN XL) 300 MG 24 hr tablet Take 1 tablet (300 mg total) by mouth every morning.   No facility-administered encounter medications on file as of 10/27/2023.    Review of Systems  Review of Systems  Constitutional: Negative.   HENT: Negative.    Cardiovascular:  Positive for palpitations.  Gastrointestinal: Negative.   Allergic/Immunologic: Negative.   Neurological:  Positive for dizziness.  Psychiatric/Behavioral: Negative.       Objective:   BP 132/76 (BP Location: Left Arm, Patient Position: Sitting, Cuff Size: Normal)   Pulse (!) 105   Temp 98.7 F (37.1 C)   Resp 12   Ht 5'  3" (1.6 m)   Wt 225 lb (102.1 kg)   LMP 10/25/2023   SpO2 100%   BMI 39.86 kg/m   Wt Readings from Last 5 Encounters:  10/27/23 225 lb (102.1 kg)  09/22/23 229 lb 6.4 oz (104.1 kg)  08/21/23 238 lb (108 kg)  06/21/23 225 lb (102.1 kg)  06/15/23 225 lb (102.1 kg)     Physical Exam Vitals and nursing note reviewed.  Constitutional:      General: She is not in acute distress.    Appearance: She is well-developed.  Cardiovascular:     Rate and Rhythm: Normal rate and regular rhythm.  Pulmonary:     Effort:  Pulmonary effort is normal.     Breath sounds: Normal breath sounds.  Neurological:     Mental Status: She is alert and oriented to person, place, and time.       Assessment & Plan:   Postural dizziness with presyncope -     Ambulatory referral to Neurology  Irregular menstrual bleeding -     Ambulatory referral to Obstetrics / Gynecology  Lung nodule -     Ambulatory referral to Pulmonology     Return in about 3 months (around 01/25/2024).   Ivonne Andrew, NP 10/27/2023

## 2023-10-27 NOTE — Patient Instructions (Signed)
1. Postural dizziness with presyncope  - Ambulatory referral to Neurology  2. Irregular menstrual bleeding  - Ambulatory referral to Obstetrics / Gynecology  3. Lung nodule  - Ambulatory referral to Pulmonology  Follow up:  Follow up in 3 months

## 2023-11-03 ENCOUNTER — Other Ambulatory Visit: Payer: Self-pay

## 2023-11-04 ENCOUNTER — Other Ambulatory Visit: Payer: Self-pay

## 2023-11-30 ENCOUNTER — Ambulatory Visit: Payer: Self-pay | Admitting: Physician Assistant

## 2024-05-09 ENCOUNTER — Encounter (HOSPITAL_BASED_OUTPATIENT_CLINIC_OR_DEPARTMENT_OTHER): Payer: Self-pay | Admitting: Emergency Medicine

## 2024-05-09 ENCOUNTER — Emergency Department (HOSPITAL_BASED_OUTPATIENT_CLINIC_OR_DEPARTMENT_OTHER)

## 2024-05-09 ENCOUNTER — Telehealth: Payer: Self-pay

## 2024-05-09 ENCOUNTER — Emergency Department (HOSPITAL_BASED_OUTPATIENT_CLINIC_OR_DEPARTMENT_OTHER)
Admission: EM | Admit: 2024-05-09 | Discharge: 2024-05-09 | Disposition: A | Discharge status: Home / Self Care | Attending: Emergency Medicine | Admitting: Emergency Medicine

## 2024-05-09 ENCOUNTER — Other Ambulatory Visit: Payer: Self-pay

## 2024-05-09 DIAGNOSIS — R1010 Upper abdominal pain, unspecified: Secondary | ICD-10-CM | POA: Diagnosis present

## 2024-05-09 DIAGNOSIS — R634 Abnormal weight loss: Secondary | ICD-10-CM | POA: Diagnosis not present

## 2024-05-09 DIAGNOSIS — R300 Dysuria: Secondary | ICD-10-CM | POA: Diagnosis not present

## 2024-05-09 DIAGNOSIS — R0789 Other chest pain: Secondary | ICD-10-CM | POA: Insufficient documentation

## 2024-05-09 DIAGNOSIS — R1013 Epigastric pain: Secondary | ICD-10-CM | POA: Diagnosis not present

## 2024-05-09 LAB — CBC WITH DIFFERENTIAL/PLATELET
Abs Immature Granulocytes: 0.03 10*3/uL (ref 0.00–0.07)
Basophils Absolute: 0.1 10*3/uL (ref 0.0–0.1)
Basophils Relative: 1 %
Eosinophils Absolute: 0.2 10*3/uL (ref 0.0–0.5)
Eosinophils Relative: 2 %
HCT: 36.5 % (ref 36.0–46.0)
Hemoglobin: 12.3 g/dL (ref 12.0–15.0)
Immature Granulocytes: 0 %
Lymphocytes Relative: 31 %
Lymphs Abs: 3.3 10*3/uL (ref 0.7–4.0)
MCH: 28.5 pg (ref 26.0–34.0)
MCHC: 33.7 g/dL (ref 30.0–36.0)
MCV: 84.7 fL (ref 80.0–100.0)
Monocytes Absolute: 0.6 10*3/uL (ref 0.1–1.0)
Monocytes Relative: 6 %
Neutro Abs: 6.4 10*3/uL (ref 1.7–7.7)
Neutrophils Relative %: 60 %
Platelets: 318 10*3/uL (ref 150–400)
RBC: 4.31 MIL/uL (ref 3.87–5.11)
RDW: 13.8 % (ref 11.5–15.5)
WBC: 10.5 10*3/uL (ref 4.0–10.5)
nRBC: 0 % (ref 0.0–0.2)

## 2024-05-09 LAB — COMPREHENSIVE METABOLIC PANEL WITH GFR
ALT: 25 U/L (ref 0–44)
AST: 21 U/L (ref 15–41)
Albumin: 4.2 g/dL (ref 3.5–5.0)
Alkaline Phosphatase: 87 U/L (ref 38–126)
Anion gap: 13 (ref 5–15)
BUN: 12 mg/dL (ref 6–20)
CO2: 24 mmol/L (ref 22–32)
Calcium: 10 mg/dL (ref 8.9–10.3)
Chloride: 102 mmol/L (ref 98–111)
Creatinine, Ser: 0.85 mg/dL (ref 0.44–1.00)
GFR, Estimated: >60 mL/min (ref >60)
Glucose, Bld: 118 mg/dL — ABNORMAL HIGH (ref 70–99)
Potassium: 3.7 mmol/L (ref 3.5–5.1)
Sodium: 139 mmol/L (ref 135–145)
Total Bilirubin: 0.3 mg/dL (ref 0.0–1.2)
Total Protein: 7 g/dL (ref 6.5–8.1)

## 2024-05-09 LAB — LIPASE, BLOOD: Lipase: 33 U/L (ref 11–51)

## 2024-05-09 LAB — URINALYSIS, ROUTINE W REFLEX MICROSCOPIC
Bilirubin Urine: NEGATIVE
Glucose, UA: NEGATIVE mg/dL
Hgb urine dipstick: NEGATIVE
Ketones, ur: NEGATIVE mg/dL
Leukocytes,Ua: NEGATIVE
Nitrite: NEGATIVE
Protein, ur: NEGATIVE mg/dL
Specific Gravity, Urine: <=1.005 (ref 1.005–1.030)
pH: 6 (ref 5.0–8.0)

## 2024-05-09 LAB — TROPONIN T, HIGH SENSITIVITY: Troponin T High Sensitivity: <15 ng/L (ref <19)

## 2024-05-09 LAB — HCG, SERUM, QUALITATIVE: Preg, Serum: NEGATIVE

## 2024-05-09 IMAGING — DX DG LUMBAR SPINE COMPLETE 4+V
5 series · 5 of 5 positions shown · non-contrast
Comparison: CT abdomen pelvis dated 12/23/2021.

CLINICAL DATA: Back pain.

EXAM:
LUMBAR SPINE - COMPLETE 4+ VIEW

[l-spine ap]
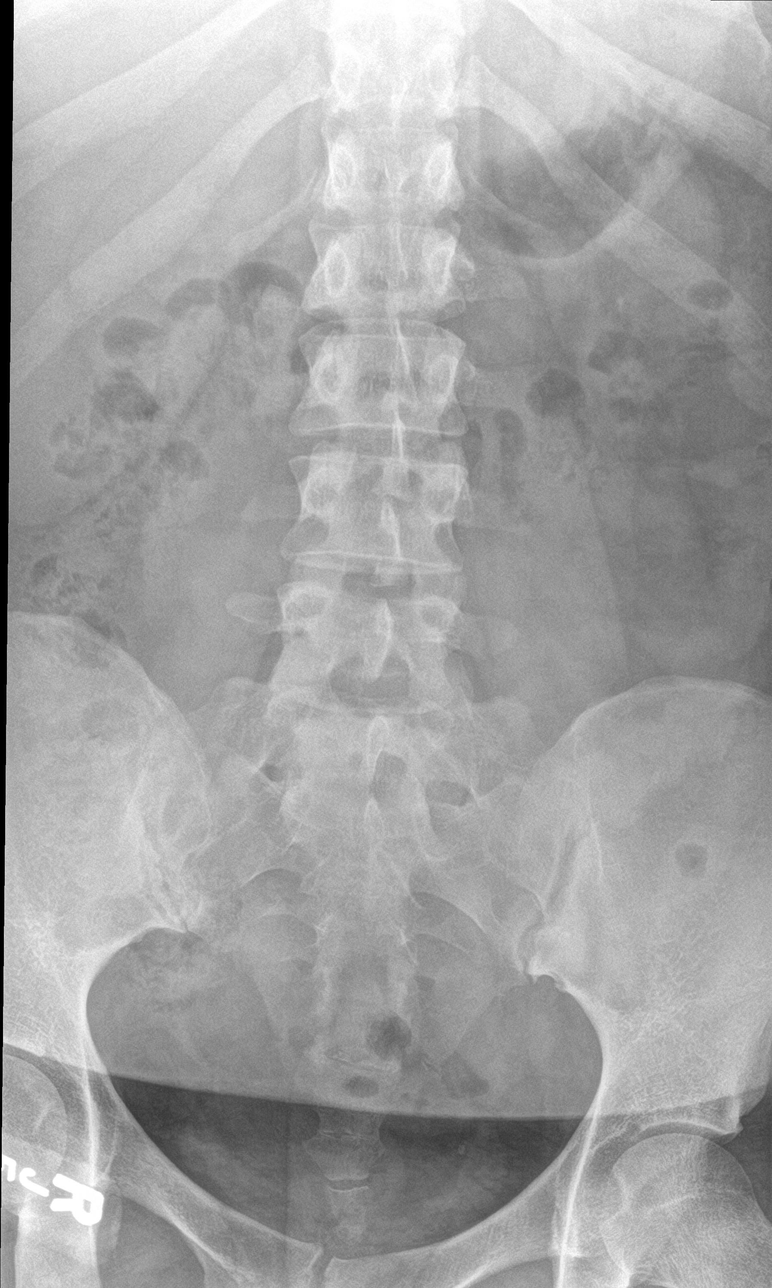

[l-spine obl (1 of 2)]
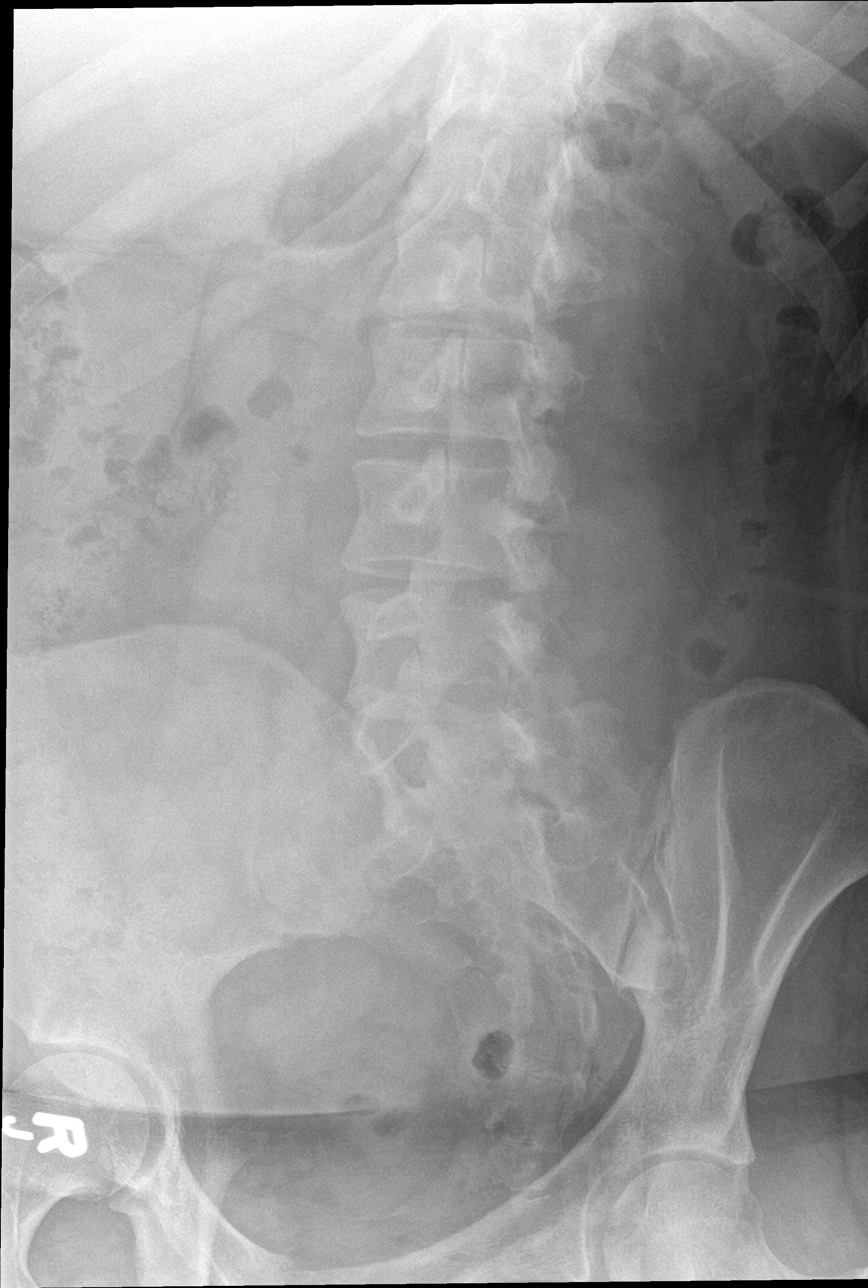

[l-spine obl (2 of 2)]
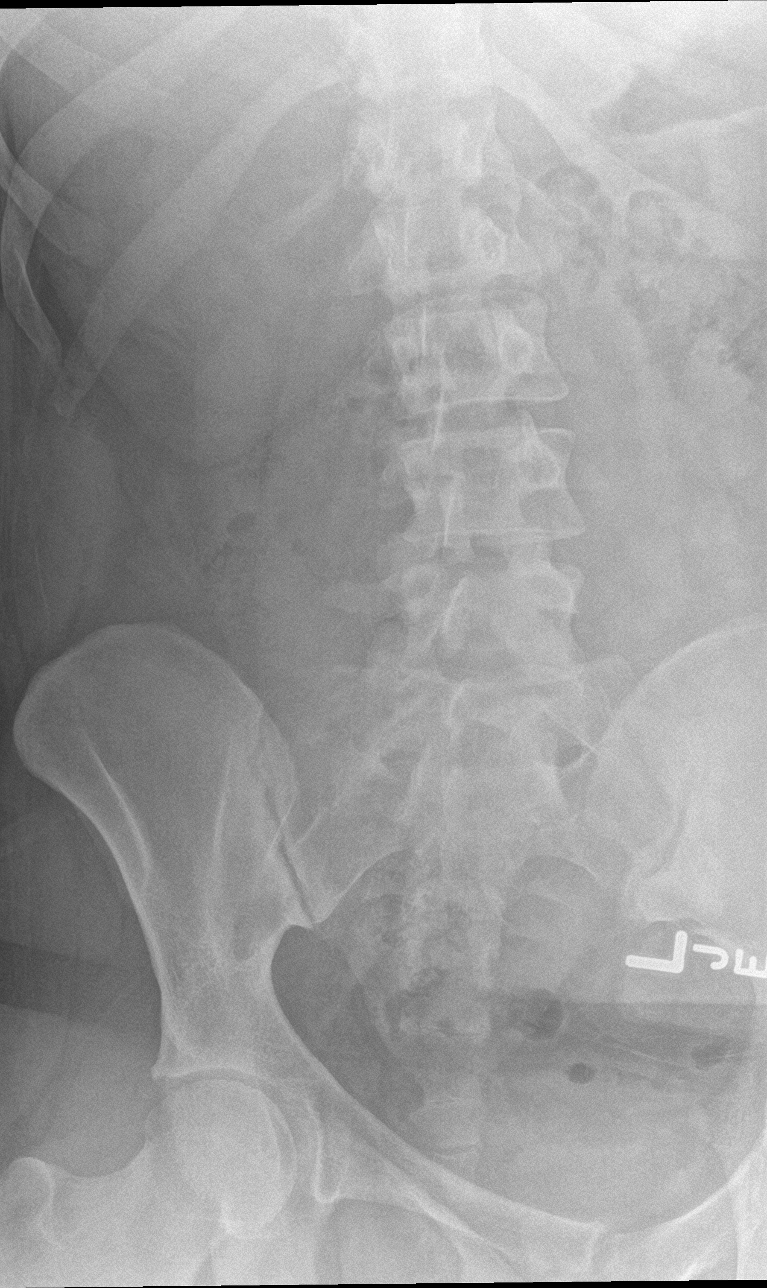

[l-spine lat]
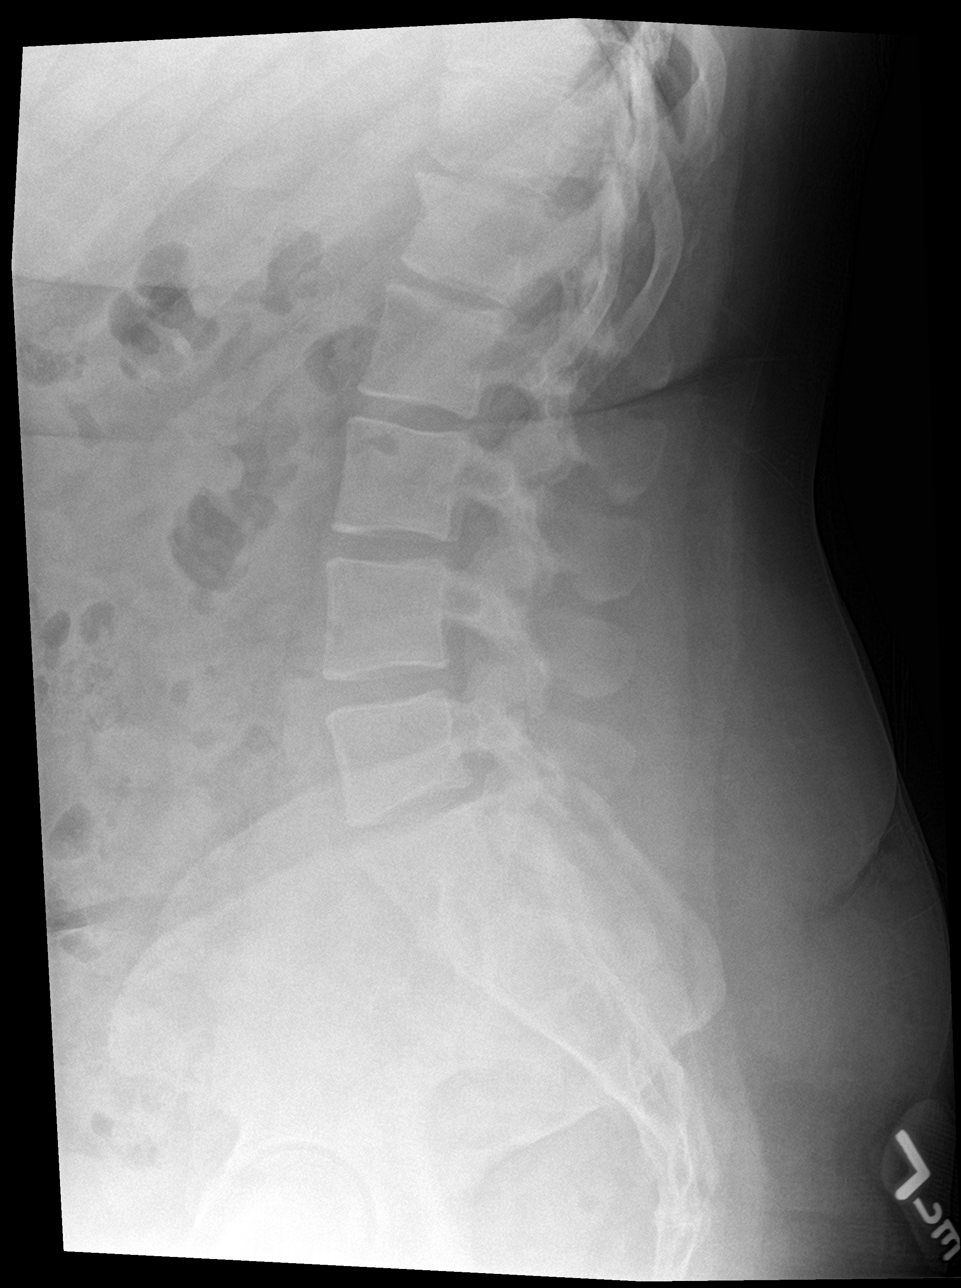

[l-spine spot]
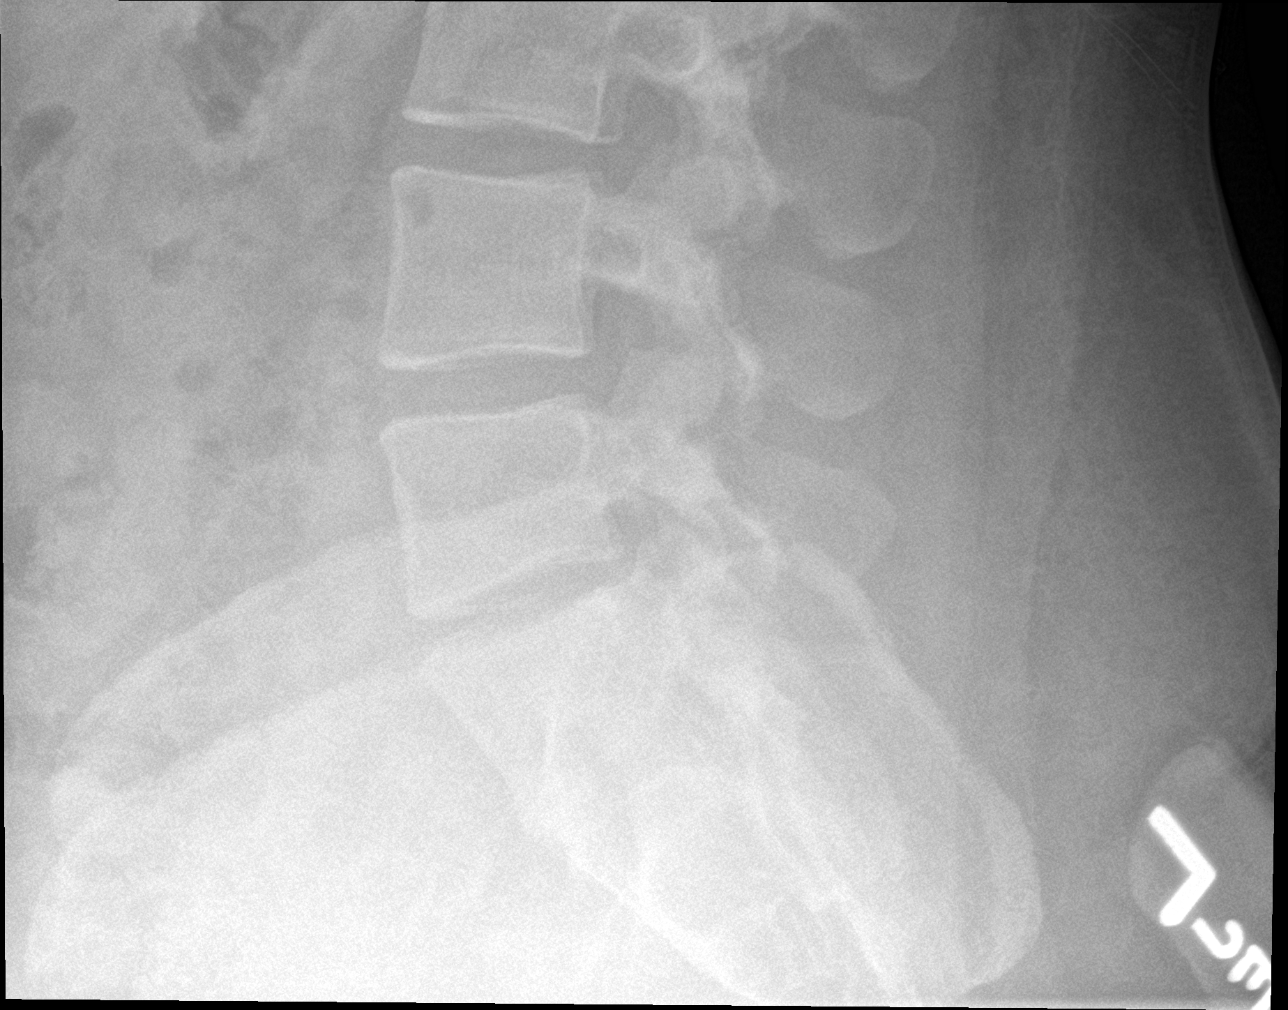

[5 of 5 positions shown; findings below may reference images not displayed]

FINDINGS: The L5 is transitional. No acute fracture or subluxation of the
lumbar spine. The vertebral body heights are maintained. The
visualized posterior elements are intact. The soft tissues are
unremarkable.
IMPRESSION: No acute findings.

## 2024-05-09 MED ORDER — SUCRALFATE 1 G PO TABS: 1.0000 g | ORAL_TABLET | Freq: Three times a day (TID) | ORAL | 0 refills | Status: AC

## 2024-05-09 MED ORDER — IOHEXOL 300 MG/ML  SOLN
100.0000 mL | Freq: Once | INTRAMUSCULAR | Status: AC | PRN
  Administered 2024-05-09: 100 mL via INTRAVENOUS

## 2024-05-09 MED ORDER — SODIUM CHLORIDE 0.9 % IV BOLUS
1000.0000 mL | Freq: Once | INTRAVENOUS | Status: AC
  Administered 2024-05-09: 1000 mL via INTRAVENOUS

## 2024-05-09 NOTE — ED Provider Notes (Signed)
 [place] EMERGENCY DEPARTMENT AT [facility] Provider Note   CSN: [id number] Arrival date & time: 05/09/24  0254     Patient presents with: Withdrawal   Patty Wilcox is a 28 y.o. female.   Patient is a 28 year old female with past medical history of oppositional defiant disorder, generalized anxiety disorder, borderline personality disorder, anxiety, ADHD, and alcohol abuse.  Patient presenting today with multiple complaints patient describes tightness in her chest and upper abdomen starting earlier this evening that she states makes it hard for her to breathe.  She also reports feeling generally unwell, weight loss, burning with urination.  She also tells me she has not consumed alcohol in 3 weeks.       Prior to Admission medications   Medication Sig Start Date End Date Taking? Authorizing Provider  amphetamine-dextroamphetamine (ADDERALL XR) 20 MG 24 hr capsule Take 20 mg by mouth daily.    [provider]  hydrOXYzine  (ATARAX ) 25 MG tablet Take 1 tablet (25 mg total) by mouth every 6 (six) hours. 08/22/23   Volney Grumbles, PA-C  nicotine  (NICODERM CQ  - DOSED IN MG/24 HOURS) 14 mg/24hr patch Place 1 patch (14 mg total) onto the skin daily. Remove before bedtime. Patient not taking: Reported on 08/22/2023 03/05/23   Ulysess Gang A  omeprazole (PRILOSEC) 20 MG capsule Take 20 mg by mouth daily.    [provider]  Semaglutide -Weight Management (WEGOVY ) 0.25 MG/0.5ML SOAJ Inject 0.25 mg into the skin once a week. Patient not taking: Reported on 10/27/2023 10/07/23   Jerrlyn Morel, NP  traMADol  (ULTRAM ) 50 MG tablet Take 1 tablet (50 mg total) by mouth every 6 (six) hours as needed. Patient not taking: Reported on 09/22/2023 08/21/23   [name], Kristie M, DO    Allergies: Patient has no known allergies.    Review of Systems  All other systems reviewed and are negative.   Updated Vital Signs BP 119/73 (BP Location: Right Arm)   Pulse 88    Temp 99 F (37.2 C)   Resp 16   Ht 5' 3 (1.6 m)   Wt 93.4 kg   LMP 04/25/2024 (Approximate)   SpO2 98%   BMI 36.49 kg/m   Physical Exam Vitals and nursing note reviewed.  Constitutional:      General: She is not in acute distress.    Appearance: She is well-developed. She is not diaphoretic.  HENT:     Head: Normocephalic and atraumatic.   Eyes:     Extraocular Movements: Extraocular movements intact.     Pupils: Pupils are equal, round, and reactive to light.    Cardiovascular:     Rate and Rhythm: Normal rate and regular rhythm.     Heart sounds: No murmur heard.    No friction rub. No gallop.  Pulmonary:     Effort: Pulmonary effort is normal. No respiratory distress.     Breath sounds: Normal breath sounds. No wheezing.  Abdominal:     General: Bowel sounds are normal. There is no distension.     Palpations: Abdomen is soft.     Tenderness: There is no abdominal tenderness.   Musculoskeletal:        General: Normal range of motion.     Cervical back: Normal range of motion and neck supple.   Skin:    General: Skin is warm and dry.   Neurological:     General: No focal deficit present.     Mental Status: She is  alert and oriented to person, place, and time.     (all labs ordered are listed, but only abnormal results are displayed) Labs Reviewed  COMPREHENSIVE METABOLIC PANEL WITH GFR  LIPASE, BLOOD  CBC WITH DIFFERENTIAL/PLATELET  HCG, SERUM, QUALITATIVE  URINALYSIS, ROUTINE W REFLEX MICROSCOPIC  TROPONIN T, HIGH SENSITIVITY    EKG: None  Radiology: No results found.   Procedures   Medications Ordered in the ED  sodium chloride  0.9 % bolus 1,000 mL (has no administration in time range)                                    Medical Decision Making Amount and/or Complexity of Data Reviewed Labs: ordered. Radiology: ordered.  Risk Prescription drug management.   Patient is a 28 year old female presenting with complaints of epigastric  discomfort and multiple other issues.  Patient arrives with stable vital signs and is afebrile.  There is some tenderness to the epigastric region, but no peritoneal signs are appreciated.  Laboratory studies obtained including CBC, CMP, and lipase, all of which are unremarkable.  There is no leukocytosis, no elevation of liver or pancreatic enzymes, and no electrolyte derangement.  Urinalysis is clear and pregnancy test is negative.  Troponin is negative.  Chest x-ray obtained showing no acute process.  CT scan of the abdomen and pelvis showing no acute process.  At this point, cause of patient's pain is unclear, but nothing appears emergent.  She has been taking omeprazole for her hiatal hernia and I will add Carafate to take with meals.  Patient is to follow-up with her GI doctor.     Final diagnoses:  None    ED Discharge Orders     None          [name], MD 05/09/24 [phone]

## 2024-05-09 NOTE — Transitions of Care (Post Inpatient/ED Visit) (Signed)
   05/09/2024  Name: SABELLA TRAORE MRN: [id number] DOB: 1996/08/18  Today's TOC FU Call Status: Today's TOC FU Call Status:: Unsuccessful Call (1st Attempt) Unsuccessful Call (1st Attempt) Date: 05/09/24  Attempted to reach the patient regarding the most recent Inpatient/ED visit.  Follow Up Plan: Additional outreach attempts will be made to reach the patient to complete the Transitions of Care (Post Inpatient/ED visit) call.   Signature  [employer], [place]

## 2024-05-09 NOTE — ED Notes (Addendum)
 Patient transported to CT

## 2024-05-09 NOTE — Telephone Encounter (Signed)
 Copied from CRM [id number]. Topic: General - Other >> May 09, 2024 12:12 PM Marissa P wrote: Reason for CRM: Patient was giving [employer], RMA a call back, reached office back during lunch, please follow up when you can

## 2024-05-09 NOTE — ED Triage Notes (Signed)
 Pt states having a weird feeling after not drinking alcohol for a month. States Dx with fatty liver. At home felt confused and out of it.

## 2024-05-09 NOTE — Discharge Instructions (Signed)
 Begin taking Carafate as prescribed.  Continue other medications as previously prescribed.  Follow-up with your gastroenterologist if your symptoms are not improving in the next week.

## 2024-05-10 ENCOUNTER — Telehealth: Payer: Self-pay

## 2024-05-10 NOTE — Transitions of Care (Post Inpatient/ED Visit) (Signed)
   05/10/2024  Name: Patty Wilcox MRN: [id number] DOB: [birth year]  Today's TOC FU Call Status:   Patient's Name and Date of Birth confirmed.  Transition Care Management Follow-up Telephone Call Discharge Facility: [facility] Type of Discharge: Emergency Department Reason for ED Visit: Other: How have you been since you were released from the hospital?: Better Any questions or concerns?: No  Items Reviewed: Did you receive and understand the discharge instructions provided?: No Any new allergies since your discharge?: No Dietary orders reviewed?: NA Do you have support at home?: Yes People in Home [RPT]: significant other, parent(s), friend(s)  Medications Reviewed Today: Medications Reviewed Today     Reviewed by [name], CMA (Certified Medical Assistant) on 05/10/24 at 1119  Med List Status: <None>   Medication Order Taking? Sig Documenting Provider Last Dose Status Informant  amphetamine-dextroamphetamine (ADDERALL XR) 20 MG 24 hr capsule [id number] Yes Take 20 mg by mouth daily. [provider]  Active   hydrOXYzine  (ATARAX ) 25 MG tablet [id number] Yes Take 1 tablet (25 mg total) by mouth every 6 (six) hours. Volney Grumbles, PA-C  Active   nicotine  (NICODERM CQ  - DOSED IN MG/24 HOURS) 14 mg/24hr patch [id number]  Place 1 patch (14 mg total) onto the skin daily. Remove before bedtime.  Patient not taking: Reported on 08/22/2023   [name]  Active Self  omeprazole (PRILOSEC) 20 MG capsule [id number] Yes Take 20 mg by mouth daily. [provider]  Active   Semaglutide -Weight Management (WEGOVY ) 0.25 MG/0.5ML SOAJ [id number]  Inject 0.25 mg into the skin once a week.  Patient not taking: Reported on 10/27/2023   [name], [name], NP  Active   sucralfate (CARAFATE) 1 g tablet [id number] Yes Take 1 tablet (1 g total) by mouth 4 (four) times daily -  with meals and at bedtime. [name], MD  Active   traMADol  (ULTRAM ) 50 MG tablet  [id number]  Take 1 tablet (50 mg total) by mouth every 6 (six) hours as needed.  Patient not taking: Reported on 09/22/2023   [name], [name], DO  Active Self            Home Care and Equipment/Supplies: Were Home Health Services Ordered?: NA Any new equipment or medical supplies ordered?: NA  Functional Questionnaire: Do you need assistance with bathing/showering or dressing?: No Do you need assistance with meal preparation?: No Do you need assistance with eating?: No Do you have difficulty maintaining continence: No Do you need assistance with getting out of bed/getting out of a chair/moving?: No Do you have difficulty managing or taking your medications?: No  Follow up appointments reviewed: PCP Follow-up appointment confirmed?: Yes Date of PCP follow-up appointment?: 05/11/24 Follow-up Provider: [facility] Follow-up appointment confirmed?: NA Do you need transportation to your follow-up appointment?: No Do you understand care options if your condition(s) worsen?: Yes-patient verbalized understanding    SIGNATURE [name], RMA
